# Patient Record
Sex: Male | Born: 1975 | Race: White | Hispanic: No | Marital: Single | Smoking: Never smoker
Health system: Southern US, Community
[De-identification: ages and names within clinical notes are randomized; demographics above are authoritative.]

## PROBLEM LIST (undated history)

## (undated) DIAGNOSIS — G4730 Sleep apnea, unspecified: Secondary | ICD-10-CM

## (undated) DIAGNOSIS — R519 Headache, unspecified: Secondary | ICD-10-CM

## (undated) DIAGNOSIS — R51 Headache: Secondary | ICD-10-CM

## (undated) HISTORY — PX: REFRACTIVE SURGERY: SHX103

## (undated) HISTORY — PX: WISDOM TOOTH EXTRACTION: SHX21

---

## 2018-04-30 ENCOUNTER — Other Ambulatory Visit: Payer: Self-pay | Admitting: Otolaryngology

## 2018-04-30 DIAGNOSIS — J349 Unspecified disorder of nose and nasal sinuses: Secondary | ICD-10-CM

## 2018-04-30 DIAGNOSIS — J329 Chronic sinusitis, unspecified: Secondary | ICD-10-CM

## 2018-05-09 ENCOUNTER — Ambulatory Visit
Admission: RE | Admit: 2018-05-09 | Discharge: 2018-05-09 | Disposition: A | Discharge status: Home / Self Care | Payer: 59 | Source: Ambulatory Visit | Attending: Otolaryngology | Admitting: Otolaryngology

## 2018-05-09 DIAGNOSIS — J329 Chronic sinusitis, unspecified: Secondary | ICD-10-CM

## 2018-05-09 DIAGNOSIS — J349 Unspecified disorder of nose and nasal sinuses: Secondary | ICD-10-CM

## 2018-05-29 ENCOUNTER — Ambulatory Visit: Payer: Self-pay | Admitting: Otolaryngology

## 2018-05-29 NOTE — Pre-Procedure Instructions (Signed)
Sofie RowerJeffrey Pruiett  05/29/2018      [facility] - [place], [place] - [address] [place] [id number] Phone: [phone] Fax: [phone]    Your procedure is scheduled on Friday, August 23rd.  [facility] Ad[facility] at 8:00 A.M.  Call this number if you have problems the morning of surgery:  [phone]   Remember:  Do not eat or drink after midnight.      Take these medicines the morning of surgery with A SIP OF WATER  gemfibrozil (LOPID) 600 MG tablet topiramate (TOPAMAX) 25 MG tablet  As needed:  fluticasone (FLONASE) 50 MCG/ACT nasal spray  7 days prior to surgery STOP taking any Aspirin(unless otherwise instructed by your surgeon), Aleve, Naproxen, Ibuprofen, Motrin, Advil, Goody's, BC's, all herbal medications, fish oil, and all vitamins     Do not wear jewelry.  Do not wear lotions, powders, or perfumes, or deodorant.  Men may shave face and neck.  Do not bring valuables to the hospital.  [facility] is not responsible for any belongings or valuables.  Contacts, dentures or bridgework may not be worn into surgery.  Leave your suitcase in the car.  After surgery it may be brought to your room.  For patients admitted to the hospital, discharge time will be determined by your treatment team.  Patients discharged the day of surgery will not be allowed to drive home.    Special instructions:   [facility]- Preparing For Surgery  Before surgery, you can play an important role. Because skin is not sterile, your skin needs to be as free of germs as possible. You can reduce the number of germs on your skin by washing with CHG (chlorahexidine gluconate) Soap before surgery.  CHG is an antiseptic cleaner which kills germs and bonds with the skin to continue killing germs even after washing.    Oral Hygiene is also important to reduce your risk of infection.  Remember - BRUSH YOUR TEETH THE MORNING OF SURGERY  WITH YOUR REGULAR TOOTHPASTE  Please do not use if you have an allergy to CHG or antibacterial soaps. If your skin becomes reddened/irritated stop using the CHG.  Do not shave (including legs and underarms) for at least 48 hours prior to first CHG shower. It is OK to shave your face.  Please follow these instructions carefully.   1. Shower the NIGHT BEFORE SURGERY and the MORNING OF SURGERY with CHG.   2. If you chose to wash your hair, wash your hair first as usual with your normal shampoo.  3. After you shampoo, rinse your hair and body thoroughly to remove the shampoo.  4. Use CHG as you would any other liquid soap. You can apply CHG directly to the skin and wash gently with a scrungie or a clean washcloth.   5. Apply the CHG Soap to your body ONLY FROM THE NECK DOWN.  Do not use on open wounds or open sores. Avoid contact with your eyes, ears, mouth and genitals (private parts). Wash Face and genitals (private parts)  with your normal soap.  6. Wash thoroughly, paying special attention to the area where your surgery will be performed.  7. Thoroughly rinse your body with warm water from the neck down.  8. DO NOT shower/wash with your normal soap after using and rinsing off the CHG Soap.  9. Pat yourself dry with a CLEAN TOWEL.  10. Wear CLEAN PAJAMAS to bed the  night before surgery, wear comfortable clothes the morning of surgery  11. Place CLEAN SHEETS on your bed the night of your first shower and DO NOT SLEEP WITH PETS.    Day of Surgery:  Do not apply any deodorants/lotions.  Please wear clean clothes to the hospital/surgery center.   Remember to brush your teeth WITH YOUR REGULAR TOOTHPASTE.    Please read over the following fact sheets that you were given.

## 2018-05-29 NOTE — H&P (Signed)
PREOPERATIVE H&P  Chief Complaint: Chronic nasal obstruction  HPI: Mark Braun is a 42 y.o. male who presents for evaluation of chronic nasal obstruction and history of sinus infections. He uses nasal steroid sprays as well as regular use of Afrin in order to breathe adequately. He has history of obstructive sleep apnea and wears nasal CPAP most nights but has difficulty tolerating this. He had a CT scan performed which showed clear paranasal sinuses with septal deviation and turbinate hypertrophy. He is taken to the operating room at this time for septoplasty and turbinate reductions to help improve his nasal airway.  No past medical history on file.  Social History   Socioeconomic History  . Marital status: Single    Spouse name: Not on file  . Number of children: Not on file  . Years of education: Not on file  . Highest education level: Not on file  Occupational History  . Not on file  Social Needs  . Financial resource strain: Not on file  . Food insecurity:    Worry: Not on file    Inability: Not on file  . Transportation needs:    Medical: Not on file    Non-medical: Not on file  Tobacco Use  . Smoking status: Not on file  Substance and Sexual Activity  . Alcohol use: Not on file  . Drug use: Not on file  . Sexual activity: Not on file  Lifestyle  . Physical activity:    Days per week: Not on file    Minutes per session: Not on file  . Stress: Not on file  Relationships  . Social connections:    Talks on phone: Not on file    Gets together: Not on file    Attends religious service: Not on file    Active member of club or organization: Not on file    Attends meetings of clubs or organizations: Not on file    Relationship status: Not on file  Other Topics Concern  . Not on file  Social History Narrative  . Not on file   No family history on file. No Known Allergies Prior to Admission medications   Medication Sig Start Date End Date Taking? Authorizing  Provider  fluticasone (FLONASE) 50 MCG/ACT nasal spray Place 1 spray into both nostrils daily as needed for allergies or rhinitis.   Yes [provider]  gemfibrozil (LOPID) 600 MG tablet Take 600 mg by mouth 2 (two) times daily.   Yes [provider]  topiramate (TOPAMAX) 25 MG tablet Take 75 mg by mouth 2 (two) times daily.   Yes [provider]     Positive ROS: per history of present illness  All other systems have been reviewed and were otherwise negative with the exception of those mentioned in the HPI and as above.  Physical Exam: There were no vitals filed for this visit.  General: Alert, no acute distress Oral: Normal oral mucosa and tonsils Nasal: septal deviation to the right with large turbinates. No polyps noted. Neck: No palpable adenopathy or thyroid nodules Ear: Ear canal is clear with normal appearing TMs Cardiovascular: Regular rate and rhythm, no murmur.  Respiratory: Clear to auscultation Neurologic: Alert and oriented x 3   Assessment/Plan: deviated septum with nasal obstruction. obstructive sleep apnea Plan for Procedure(s): NASAL SEPTOPLASTY WITH BILATERAL TURBINATE REDUCTION   Dillard Cannonhristopher Amed Datta, MD 05/29/2018 4:32 PM

## 2018-05-30 ENCOUNTER — Encounter (HOSPITAL_COMMUNITY)
Admission: RE | Admit: 2018-05-30 | Discharge: 2018-05-30 | Disposition: A | Discharge status: Home / Self Care | Payer: 59 | Source: Ambulatory Visit | Attending: Otolaryngology | Admitting: Otolaryngology

## 2018-05-30 ENCOUNTER — Encounter (HOSPITAL_COMMUNITY): Payer: Self-pay

## 2018-05-30 ENCOUNTER — Other Ambulatory Visit: Payer: Self-pay

## 2018-05-30 DIAGNOSIS — Z01812 Encounter for preprocedural laboratory examination: Secondary | ICD-10-CM | POA: Insufficient documentation

## 2018-05-30 HISTORY — DX: Headache, unspecified: R51.9

## 2018-05-30 HISTORY — DX: Sleep apnea, unspecified: G47.30

## 2018-05-30 HISTORY — DX: Headache: R51

## 2018-05-30 LAB — CBC
HEMATOCRIT: 48.4 % (ref 39.0–52.0)
Hemoglobin: 15.9 g/dL (ref 13.0–17.0)
MCH: 28.3 pg (ref 26.0–34.0)
MCHC: 32.9 g/dL (ref 30.0–36.0)
MCV: 86.1 fL (ref 78.0–100.0)
PLATELETS: 405 10*3/uL — AB (ref 150–400)
RBC: 5.62 MIL/uL (ref 4.22–5.81)
RDW: 12.9 % (ref 11.5–15.5)
WBC: 7.2 10*3/uL (ref 4.0–10.5)

## 2018-06-08 ENCOUNTER — Ambulatory Visit (HOSPITAL_COMMUNITY): Payer: 59 | Admitting: Certified Registered Nurse Anesthetist

## 2018-06-08 ENCOUNTER — Observation Stay (HOSPITAL_COMMUNITY)
Admission: RE | Admit: 2018-06-08 | Discharge: 2018-06-09 | Disposition: A | Discharge status: Home / Self Care | Payer: 59 | Source: Ambulatory Visit | Attending: Otolaryngology | Admitting: Otolaryngology

## 2018-06-08 ENCOUNTER — Encounter (HOSPITAL_COMMUNITY): Payer: Self-pay | Admitting: *Deleted

## 2018-06-08 ENCOUNTER — Encounter (HOSPITAL_COMMUNITY)
Admission: RE | Disposition: A | Discharge status: Home / Self Care | Payer: Self-pay | Source: Ambulatory Visit | Attending: Otolaryngology

## 2018-06-08 ENCOUNTER — Other Ambulatory Visit: Payer: Self-pay

## 2018-06-08 DIAGNOSIS — E669 Obesity, unspecified: Secondary | ICD-10-CM | POA: Diagnosis not present

## 2018-06-08 DIAGNOSIS — G4733 Obstructive sleep apnea (adult) (pediatric): Principal | ICD-10-CM | POA: Insufficient documentation

## 2018-06-08 DIAGNOSIS — J342 Deviated nasal septum: Secondary | ICD-10-CM | POA: Diagnosis not present

## 2018-06-08 DIAGNOSIS — Z79899 Other long term (current) drug therapy: Secondary | ICD-10-CM | POA: Insufficient documentation

## 2018-06-08 DIAGNOSIS — J3489 Other specified disorders of nose and nasal sinuses: Secondary | ICD-10-CM | POA: Insufficient documentation

## 2018-06-08 DIAGNOSIS — J343 Hypertrophy of nasal turbinates: Secondary | ICD-10-CM | POA: Insufficient documentation

## 2018-06-08 HISTORY — PX: NASAL SEPTOPLASTY W/ TURBINOPLASTY: SHX2070

## 2018-06-08 SURGERY — SEPTOPLASTY, NOSE, WITH NASAL TURBINATE REDUCTION
Anesthesia: General | Site: Nose | Laterality: Bilateral

## 2018-06-08 MED ORDER — CEFAZOLIN SODIUM-DEXTROSE 2-4 GM/100ML-% IV SOLN
2.0000 g | INTRAVENOUS | Status: AC
  Administered 2018-06-08: 2 g via INTRAVENOUS
  Filled 2018-06-08: qty 100

## 2018-06-08 MED ORDER — ETOMIDATE 2 MG/ML IV SOLN
INTRAVENOUS | Status: AC
  Filled 2018-06-08: qty 20

## 2018-06-08 MED ORDER — PROPOFOL 10 MG/ML IV BOLUS
INTRAVENOUS | Status: AC
  Filled 2018-06-08: qty 40

## 2018-06-08 MED ORDER — DEXAMETHASONE SODIUM PHOSPHATE 10 MG/ML IJ SOLN
INTRAMUSCULAR | Status: DC | PRN
  Administered 2018-06-08: 10 mg via INTRAVENOUS

## 2018-06-08 MED ORDER — FENTANYL CITRATE (PF) 100 MCG/2ML IJ SOLN: 25.0000 ug | INTRAMUSCULAR | Status: DC | PRN

## 2018-06-08 MED ORDER — LIDOCAINE 2% (20 MG/ML) 5 ML SYRINGE
INTRAMUSCULAR | Status: AC
  Filled 2018-06-08: qty 10

## 2018-06-08 MED ORDER — LACTATED RINGERS IV SOLN
INTRAVENOUS | Status: DC
  Administered 2018-06-08: 09:00:00 via INTRAVENOUS

## 2018-06-08 MED ORDER — LIDOCAINE 2% (20 MG/ML) 5 ML SYRINGE
INTRAMUSCULAR | Status: DC | PRN
  Administered 2018-06-08: 100 mg via INTRAVENOUS

## 2018-06-08 MED ORDER — MIDAZOLAM HCL 5 MG/5ML IJ SOLN
INTRAMUSCULAR | Status: DC | PRN
  Administered 2018-06-08: 2 mg via INTRAVENOUS

## 2018-06-08 MED ORDER — DEXAMETHASONE SODIUM PHOSPHATE 10 MG/ML IJ SOLN
INTRAMUSCULAR | Status: AC
  Filled 2018-06-08: qty 1

## 2018-06-08 MED ORDER — PROMETHAZINE HCL 25 MG/ML IJ SOLN: 6.2500 mg | INTRAMUSCULAR | Status: DC | PRN

## 2018-06-08 MED ORDER — CEFAZOLIN SODIUM-DEXTROSE 1-4 GM/50ML-% IV SOLN
1.0000 g | Freq: Three times a day (TID) | INTRAVENOUS | Status: DC
  Administered 2018-06-08 – 2018-06-09 (×3): 1 g via INTRAVENOUS
  Filled 2018-06-08 (×3): qty 50

## 2018-06-08 MED ORDER — 0.9 % SODIUM CHLORIDE (POUR BTL) OPTIME
TOPICAL | Status: DC | PRN
  Administered 2018-06-08: 1000 mL

## 2018-06-08 MED ORDER — LIDOCAINE-EPINEPHRINE 1 %-1:100000 IJ SOLN
INTRAMUSCULAR | Status: AC
  Filled 2018-06-08: qty 1

## 2018-06-08 MED ORDER — MUPIROCIN 2 % EX OINT
TOPICAL_OINTMENT | CUTANEOUS | Status: DC | PRN
  Administered 2018-06-08: 1 via TOPICAL

## 2018-06-08 MED ORDER — MIDAZOLAM HCL 2 MG/2ML IJ SOLN
INTRAMUSCULAR | Status: AC
  Filled 2018-06-08: qty 2

## 2018-06-08 MED ORDER — ROCURONIUM BROMIDE 50 MG/5ML IV SOSY
PREFILLED_SYRINGE | INTRAVENOUS | Status: AC
  Filled 2018-06-08: qty 5

## 2018-06-08 MED ORDER — METOPROLOL TARTRATE 12.5 MG HALF TABLET
12.5000 mg | ORAL_TABLET | Freq: Two times a day (BID) | ORAL | Status: DC
  Administered 2018-06-08 (×2): 12.5 mg via ORAL
  Filled 2018-06-08 (×2): qty 1

## 2018-06-08 MED ORDER — METHYLPREDNISOLONE ACETATE 80 MG/ML IJ SUSP
INTRAMUSCULAR | Status: DC | PRN
  Administered 2018-06-08: 80 mg

## 2018-06-08 MED ORDER — GEMFIBROZIL 600 MG PO TABS
600.0000 mg | ORAL_TABLET | Freq: Two times a day (BID) | ORAL | Status: DC
  Administered 2018-06-08: 600 mg via ORAL
  Filled 2018-06-08 (×2): qty 1

## 2018-06-08 MED ORDER — CHLORHEXIDINE GLUCONATE CLOTH 2 % EX PADS: 6.0000 | MEDICATED_PAD | Freq: Once | CUTANEOUS | Status: DC

## 2018-06-08 MED ORDER — PROPOFOL 10 MG/ML IV BOLUS
INTRAVENOUS | Status: AC
  Filled 2018-06-08: qty 20

## 2018-06-08 MED ORDER — IBUPROFEN 100 MG/5ML PO SUSP
400.0000 mg | Freq: Four times a day (QID) | ORAL | Status: DC | PRN
  Filled 2018-06-08: qty 20

## 2018-06-08 MED ORDER — ONDANSETRON HCL 4 MG/2ML IJ SOLN: 4.0000 mg | INTRAMUSCULAR | Status: DC | PRN

## 2018-06-08 MED ORDER — MUPIROCIN 2 % EX OINT
TOPICAL_OINTMENT | CUTANEOUS | Status: AC
  Filled 2018-06-08: qty 22

## 2018-06-08 MED ORDER — OXYMETAZOLINE HCL 0.05 % NA SOLN
NASAL | Status: DC | PRN
  Administered 2018-06-08: 1

## 2018-06-08 MED ORDER — ROCURONIUM BROMIDE 50 MG/5ML IV SOSY
PREFILLED_SYRINGE | INTRAVENOUS | Status: DC | PRN
  Administered 2018-06-08 (×3): 10 mg via INTRAVENOUS
  Administered 2018-06-08: 50 mg via INTRAVENOUS
  Administered 2018-06-08: 10 mg via INTRAVENOUS

## 2018-06-08 MED ORDER — TOPIRAMATE 25 MG PO TABS
75.0000 mg | ORAL_TABLET | Freq: Two times a day (BID) | ORAL | Status: DC
  Administered 2018-06-08: 75 mg via ORAL
  Filled 2018-06-08: qty 3

## 2018-06-08 MED ORDER — HYDROCODONE-ACETAMINOPHEN 5-325 MG PO TABS
1.0000 | ORAL_TABLET | ORAL | Status: DC | PRN
  Administered 2018-06-08: 2 via ORAL
  Administered 2018-06-09: 1 via ORAL
  Filled 2018-06-08: qty 1
  Filled 2018-06-08: qty 2

## 2018-06-08 MED ORDER — FENTANYL CITRATE (PF) 100 MCG/2ML IJ SOLN
INTRAMUSCULAR | Status: DC | PRN
  Administered 2018-06-08: 100 ug via INTRAVENOUS

## 2018-06-08 MED ORDER — FENTANYL CITRATE (PF) 250 MCG/5ML IJ SOLN
INTRAMUSCULAR | Status: AC
  Filled 2018-06-08: qty 5

## 2018-06-08 MED ORDER — OXYMETAZOLINE HCL 0.05 % NA SOLN
NASAL | Status: AC
  Filled 2018-06-08: qty 15

## 2018-06-08 MED ORDER — LIDOCAINE-EPINEPHRINE 1 %-1:100000 IJ SOLN
INTRAMUSCULAR | Status: DC | PRN
  Administered 2018-06-08: 15 mL

## 2018-06-08 MED ORDER — ONDANSETRON HCL 4 MG/2ML IJ SOLN
INTRAMUSCULAR | Status: DC | PRN
  Administered 2018-06-08: 4 mg via INTRAVENOUS

## 2018-06-08 MED ORDER — ONDANSETRON HCL 4 MG PO TABS: 4.0000 mg | ORAL_TABLET | ORAL | Status: DC | PRN

## 2018-06-08 MED ORDER — ONDANSETRON HCL 4 MG/2ML IJ SOLN
INTRAMUSCULAR | Status: AC
  Filled 2018-06-08: qty 2

## 2018-06-08 MED ORDER — PROPOFOL 10 MG/ML IV BOLUS
INTRAVENOUS | Status: DC | PRN
  Administered 2018-06-08: 250 mg via INTRAVENOUS

## 2018-06-08 MED ORDER — SUGAMMADEX SODIUM 200 MG/2ML IV SOLN
INTRAVENOUS | Status: DC | PRN
  Administered 2018-06-08: 200 mg via INTRAVENOUS

## 2018-06-08 MED ORDER — OXYCODONE HCL 5 MG PO TABS: 5.0000 mg | ORAL_TABLET | Freq: Once | ORAL | Status: DC | PRN

## 2018-06-08 MED ORDER — OXYCODONE HCL 5 MG/5ML PO SOLN: 5.0000 mg | Freq: Once | ORAL | Status: DC | PRN

## 2018-06-08 MED ORDER — MORPHINE SULFATE (PF) 2 MG/ML IV SOLN
2.0000 mg | INTRAVENOUS | Status: DC | PRN
  Administered 2018-06-08: 2 mg via INTRAVENOUS
  Filled 2018-06-08 (×2): qty 1

## 2018-06-08 MED ORDER — BACITRACIN ZINC 500 UNIT/GM EX OINT: TOPICAL_OINTMENT | CUTANEOUS | Status: DC | PRN

## 2018-06-08 MED ORDER — BACITRACIN ZINC 500 UNIT/GM EX OINT
TOPICAL_OINTMENT | CUTANEOUS | Status: AC
  Filled 2018-06-08: qty 28.35

## 2018-06-08 MED ORDER — SUMATRIPTAN SUCCINATE 25 MG PO TABS
25.0000 mg | ORAL_TABLET | ORAL | Status: DC | PRN
  Administered 2018-06-08 (×2): 25 mg via ORAL
  Filled 2018-06-08 (×6): qty 1

## 2018-06-08 SURGICAL SUPPLY — 49 items
BLADE INF TURB ROT M4 2 5PK (BLADE) ×4 IMPLANT
BLADE SURG 15 STRL LF DISP TIS (BLADE) ×1 IMPLANT
BLADE SURG 15 STRL SS (BLADE) ×1
CANISTER SUCT 3000ML (MISCELLANEOUS) ×2 IMPLANT
CANISTER SUCT 3000ML PPV (MISCELLANEOUS) ×2 IMPLANT
CLEANER TIP ELECTROSURG 2X2 (MISCELLANEOUS) ×2 IMPLANT
COAGULATOR SUCT 8FR VV (MISCELLANEOUS) ×2 IMPLANT
COAGULATOR SUCT SWTCH 10FR 6 (ELECTROSURGICAL) ×2 IMPLANT
CRADLE DONUT ADULT HEAD (MISCELLANEOUS) ×2 IMPLANT
DRAPE HALF SHEET 40X57 (DRAPES) ×2 IMPLANT
DRAPE ORTHO SPLIT 77X108 STRL (DRAPES) ×1
DRAPE SURG ORHT 6 SPLT 77X108 (DRAPES) ×1 IMPLANT
DRESSING NASAL KENNEDY 3.5X.9 (MISCELLANEOUS) IMPLANT
DRESSING TELFA 8X10 (GAUZE/BANDAGES/DRESSINGS) IMPLANT
DRSG NASAL KENNEDY 3.5X.9 (MISCELLANEOUS)
DRSG TELFA 3X8 NADH (GAUZE/BANDAGES/DRESSINGS) ×4 IMPLANT
ELECT COATED BLADE 2.86 ST (ELECTRODE) ×2 IMPLANT
ELECT REM PT RETURN 9FT ADLT (ELECTROSURGICAL)
ELECTRODE REM PT RTRN 9FT ADLT (ELECTROSURGICAL) IMPLANT
GAUZE SPONGE 2X2 8PLY STRL LF (GAUZE/BANDAGES/DRESSINGS) ×2 IMPLANT
GLOVE SS BIOGEL STRL SZ 7.5 (GLOVE) ×1 IMPLANT
GLOVE SUPERSENSE BIOGEL SZ 7.5 (GLOVE) ×1
GOWN STRL REUS W/ TWL LRG LVL3 (GOWN DISPOSABLE) ×1 IMPLANT
GOWN STRL REUS W/ TWL XL LVL3 (GOWN DISPOSABLE) ×1 IMPLANT
GOWN STRL REUS W/TWL LRG LVL3 (GOWN DISPOSABLE) ×1
GOWN STRL REUS W/TWL XL LVL3 (GOWN DISPOSABLE) ×1
KIT BASIN OR (CUSTOM PROCEDURE TRAY) ×2 IMPLANT
KIT TURNOVER KIT B (KITS) ×2 IMPLANT
NEEDLE 18GX1X1/2 (RX/OR ONLY) (NEEDLE) ×2 IMPLANT
NEEDLE HYPO 25GX1X1/2 BEV (NEEDLE) ×2 IMPLANT
NEEDLE PRECISIONGLIDE 27X1.5 (NEEDLE) ×2 IMPLANT
NS IRRIG 1000ML POUR BTL (IV SOLUTION) ×2 IMPLANT
PAD ARMBOARD 7.5X6 YLW CONV (MISCELLANEOUS) ×4 IMPLANT
PATTIES SURGICAL .5 X3 (DISPOSABLE) ×2 IMPLANT
PENCIL FOOT CONTROL (ELECTRODE) ×2 IMPLANT
SPLINT NASAL DOYLE BI-VL (GAUZE/BANDAGES/DRESSINGS) IMPLANT
SPONGE GAUZE 2X2 STER 10/PKG (GAUZE/BANDAGES/DRESSINGS) ×2
STRIP CLOSURE SKIN 1/2X4 (GAUZE/BANDAGES/DRESSINGS) IMPLANT
SUT CHROMIC 3 0 PS 2 (SUTURE) ×2 IMPLANT
SUT CHROMIC 4 0 PS 2 18 (SUTURE) ×2 IMPLANT
SUT CHROMIC 5 0 P 3 (SUTURE) IMPLANT
SUT ETHILON 3 0 PS 1 (SUTURE) ×2 IMPLANT
SUT ETHILON 4 0 PS 2 18 (SUTURE) IMPLANT
SUT ETHILON 5 0 P 3 18 (SUTURE)
SUT NYLON ETHILON 5-0 P-3 1X18 (SUTURE) IMPLANT
SUT SILK 2 0 PERMA HAND 18 BK (SUTURE) ×4 IMPLANT
SYR 3ML LL SCALE MARK (SYRINGE) ×4 IMPLANT
TRAY ENT MC OR (CUSTOM PROCEDURE TRAY) ×2 IMPLANT
WATER STERILE IRR 1000ML POUR (IV SOLUTION) ×2 IMPLANT

## 2018-06-08 NOTE — Anesthesia Procedure Notes (Signed)
Procedure Name: Intubation Date/Time: 06/08/2018 10:12 AM Performed by: Candis Shine, CRNA Pre-anesthesia Checklist: Patient identified, Emergency Drugs available, Suction available and Patient being monitored Patient Re-evaluated:Patient Re-evaluated prior to induction Oxygen Delivery Method: Circle System Utilized Preoxygenation: Pre-oxygenation with 100% oxygen Induction Type: IV induction Ventilation: Mask ventilation without difficulty Laryngoscope Size: Mac and 4 Grade View: Grade I Tube type: Oral Tube size: 7.5 mm Number of attempts: 1 Airway Equipment and Method: Stylet and Oral airway Placement Confirmation: ETT inserted through vocal cords under direct vision,  positive ETCO2 and breath sounds checked- equal and bilateral Secured at: 22 cm Tube secured with: Tape Dental Injury: Teeth and Oropharynx as per pre-operative assessment

## 2018-06-08 NOTE — Anesthesia Postprocedure Evaluation (Signed)
Anesthesia Post Note  Patient: Mark RowerJeffrey Braun  Procedure(s) Performed: NASAL SEPTOPLASTY WITH BILATERAL TURBINATE REDUCTION (Bilateral Nose)     Patient location during evaluation: PACU Anesthesia Type: General Level of consciousness: awake and alert Pain management: pain level controlled Vital Signs Assessment: post-procedure vital signs reviewed and stable Respiratory status: spontaneous breathing, nonlabored ventilation and respiratory function stable Cardiovascular status: blood pressure returned to baseline and stable Postop Assessment: no apparent nausea or vomiting Anesthetic complications: no    Last Vitals:  Vitals:   06/08/18 1320 06/08/18 1330  BP: (!) 136/100   Pulse: 83 87  Resp: 15 14  Temp:  36.6 C  SpO2: 98% 97%    Last Pain:  Vitals:   06/08/18 1330  TempSrc: Oral  PainSc:                  Beryle Lathehomas E Rinnah Peppel

## 2018-06-08 NOTE — Brief Op Note (Signed)
06/08/2018  12:04 PM  PATIENT:  Sofie RowerJeffrey Sharp  42 y.o. male  PRE-OPERATIVE DIAGNOSIS:  deviated septum  obstructive sleep apnea , turbinate hypertrophy  POST-OPERATIVE DIAGNOSIS:  Deviated Septum Obstructive Sleep Apnea, turbinate hypertrophy  PROCEDURE:  Procedure(s): NASAL SEPTOPLASTY WITH BILATERAL TURBINATE REDUCTION (Bilateral)  SURGEON:  Surgeon(s) and Role:    Drema Halon* Newman, Christopher E, MD - Primary  PHYSICIAN ASSISTANT:   ASSISTANTS: none   ANESTHESIA:   general  EBL:  100 mL   BLOOD ADMINISTERED:none  DRAINS: none   LOCAL MEDICATIONS USED:  XYLOCAINE with epi 14 cc  SPECIMEN:  No Specimen  DISPOSITION OF SPECIMEN:  N/A  COUNTS:  YES  TOURNIQUET:  * No tourniquets in log *  DICTATION: .Other Dictation: Dictation Number [id number]  PLAN OF CARE: Admit for overnight observation  PATIENT DISPOSITION:  PACU - hemodynamically stable.   Delay start of Pharmacological VTE agent (>24hrs) due to surgical blood loss or risk of bleeding: yes

## 2018-06-08 NOTE — Progress Notes (Signed)
Post Op check Awake alert without respiratory problems C/o right sided migraine type HA No bleeding noted. Will plan on removing nasal packing in am and discharge patient. Stable post op course

## 2018-06-08 NOTE — Op Note (Signed)
NAMSofie Braun: Braun, Mark MEDICAL RECORD [id number] ACCOUNT [id number] DATE OF BIRTH:Mar 01, 1976 FACILITY: [facility] LOCATION: [facility] PH[facility]ICIAN:Mark Braxton FeathersE. NEWMAN, MD  OPERATIVE REPORT  DATE OF PROCEDURE:  06/08/2018  PREOPERATIVE DIAGNOSES:  Chronic nasal obstruction with septal deviation to the right and turbinate hypertrophy.  POSTOPERATIVE DIAGNOSES:  Chronic nasal obstruction with septal deviation to the right and turbinate hypertrophy.  OPERATION PERFORMED:  Via septoplasty with bilateral inferior turbinate reductions with a Medtronic turbinate blade.  SURGEON:  Mark Cannonhristopher Newman, MD  ANESTHESIA:  General endotracheal.  ESTIMATED BLOOD LOSS:  80 mL.  COMPLICATIONS:  None.  BRIEF CLINICAL NOTE:  Mark RowerJeffrey Braun is a 42 year old gentleman who has had a longstanding history of nasal obstruction as well as obstructive sleep apnea.  He wears a CPAP at night on a regular basis.  He had a CT scan of the sinuses that showed clear  paranasal sinuses but did reveal a deviated septum to the right.  On exam, he has a narrow right nasal pathway compared to the left as well as very large turbinates bilaterally.  He was taken to the operating room at this time for septoplasty and  turbinate reduction to help improve his nasal airway.  He has previously used nasal steroid sprays and is presently using Afrin on a regular basis in order to adequately breathe.  DESCRIPTION OF PROCEDURE:  After adequate endotracheal anesthesia, the patient received 2 g of Ancef and 10 mg of Decadron IV preoperatively.  The nose was then further prepped with Betadine, draped off in sterile towels.  The septum area was injected  with 10 mL of Xylocaine with epinephrine along with the anterior inferior turbinates.  On exam, the patient had an anterior cartilaginous deviation of the septum to the right.  More posteriorly, the bony septum also put protruded more to the right.  He  had large thick turbinates bilaterally.   No polyps noted.  Both middle meatus regions were clear.  Nasopharynx was clear.  A hemitransfixion incision was made along the cartilage of the septum on the right side, and mucoperichondrial and mucoperiosteal  flap was elevated posteriorly on the right side.  A portion of the cartilaginous septum had bowed off to the right along the inferior maxillary crest and was removed along with posterior cartilaginous septum on the right side.  Just anterior to the bony  septum, a vertical incision was made, and the bony portion of the septum that bowed to the right was also removed after elevating mucoperiosteal flaps on either side of the bony septum.  This allowed the septum to return much better toward midline.   Following this, inferior turbinate reductions were performed.  Using the Medtronic turbinate blade, submucosal turbinate reductions were performed bilaterally.  On the left side was a turbinate that was a little bit more bony and protruded a little bit  more.  Some of the turbinate bone was removed on the left side only.  The turbinates were then outfractured bilaterally, and suction cautery was used to control any bleeding and to cauterize the posterior aspect of the inferior turbinates bilaterally.   Prior to closing, 80 mg of Depo-Medrol was injected into the turbinates bilaterally.  The hemitransfixion incision was closed with 3-0 chromic suture, and the septum was basted with a 3-0 chromic suture.  The turbinates were outfractured, and hemostasis  was again checked and obtained with suction cautery.  The nose was then packed with Telfa-soaked in mupirocin ointment and placed bilaterally in the nose.  This was secured anteriorly  with a 2-0 silk suture.  This completed the procedure.  The patient  was awoken from anesthesia and transferred to recovery room and postoperatively doing well.  DISPOSITION:  The patient will be observed overnight in the step-down unit for observation of his airway.  We  will plan on removing nasal packs in the morning and discharging him home to resume his CPAP therapy.  LN/NUANCE  D:06/08/2018 T:06/08/2018 JOB:[id number]

## 2018-06-08 NOTE — Anesthesia Preprocedure Evaluation (Addendum)
Anesthesia Evaluation  Patient identified by MRN, date of birth, ID band Patient awake    Reviewed: Allergy & Precautions, NPO status , Patient's Chart, lab work & pertinent test results  History of Anesthesia Complications Negative for: history of anesthetic complications  Airway Mallampati: II  TM Distance: >3 FB Neck ROM: Full    Dental  (+) Dental Advisory Given, Teeth Intact   Pulmonary sleep apnea and Continuous Positive Airway Pressure Ventilation ,    breath sounds clear to auscultation       Cardiovascular Exercise Tolerance: Good negative cardio ROS   Rhythm:Regular Rate:Normal     Neuro/Psych  Headaches, negative psych ROS   GI/Hepatic negative GI ROS, Neg liver ROS,   Endo/Other   Obesity   Renal/GU negative Renal ROS  negative genitourinary   Musculoskeletal negative musculoskeletal ROS (+)   Abdominal   Peds  Hematology negative hematology ROS (+)   Anesthesia Other Findings   Reproductive/Obstetrics                            Anesthesia Physical Anesthesia Plan  ASA: II  Anesthesia Plan: General   Post-op Pain Management:    Induction: Intravenous  PONV Risk Score and Plan: 2 and Treatment may vary due to age or medical condition, Ondansetron, Dexamethasone and Midazolam  Airway Management Planned: Oral ETT  Additional Equipment: None  Intra-op Plan:   Post-operative Plan: Extubation in OR  Informed Consent: I have reviewed the patients History and Physical, chart, labs and discussed the procedure including the risks, benefits and alternatives for the proposed anesthesia with the patient or authorized representative who has indicated his/her understanding and acceptance.   Dental advisory given  Plan Discussed with: CRNA and Anesthesiologist  Anesthesia Plan Comments:        Anesthesia Quick Evaluation

## 2018-06-08 NOTE — Interval H&P Note (Signed)
History and Physical Interval Note:  06/08/2018 9:58 AM  Mark RowerJeffrey Sitton  has presented today for surgery, with the diagnosis of deviated septum  obstructive sleep apnea  The various methods of treatment have been discussed with the patient and family. After consideration of risks, benefits and other options for treatment, the patient has consented to  Procedure(s): NASAL SEPTOPLASTY WITH BILATERAL TURBINATE REDUCTION (Bilateral) as a surgical intervention .  The patient's history has been reviewed, patient examined, no change in status, stable for surgery.  I have reviewed the patient's chart and labs.  Questions were answered to the patient's satisfaction.     [name]

## 2018-06-08 NOTE — Transfer of Care (Signed)
Immediate Anesthesia Transfer of Care Note  Patient: Mark Braun  Procedure(s) Performed: NASAL SEPTOPLASTY WITH BILATERAL TURBINATE REDUCTION (Bilateral Nose)  Patient Location: PACU  Anesthesia Type:General  Level of Consciousness: awake, alert  and oriented  Airway & Oxygen Therapy: Patient Spontanous Breathing and Patient connected to face mask oxygen  Post-op Assessment: Report given to RN and Post -op Vital signs reviewed and stable  Post vital signs: Reviewed and stable  Last Vitals:  Vitals Value Taken Time  BP 141/88 06/08/2018 12:20 PM  Temp    Pulse 81 06/08/2018 12:20 PM  Resp 16 06/08/2018 12:20 PM  SpO2 100 % 06/08/2018 12:20 PM  Vitals shown include unvalidated device data.  Last Pain:  Vitals:   06/08/18 0826  TempSrc:   PainSc: 0-No pain      Patients Stated Pain Goal: 3 (06/08/18 0826)  Complications: No apparent anesthesia complications

## 2018-06-09 ENCOUNTER — Encounter (HOSPITAL_COMMUNITY): Payer: Self-pay | Admitting: Otolaryngology

## 2018-06-09 DIAGNOSIS — G4733 Obstructive sleep apnea (adult) (pediatric): Secondary | ICD-10-CM | POA: Diagnosis not present

## 2018-06-09 MED ORDER — HYDROCODONE-ACETAMINOPHEN 5-325 MG PO TABS: 1.0000 | ORAL_TABLET | Freq: Four times a day (QID) | ORAL | 0 refills | Status: DC | PRN

## 2018-06-09 MED ORDER — CEPHALEXIN 500 MG PO CAPS: 500.0000 mg | ORAL_CAPSULE | Freq: Two times a day (BID) | ORAL | 0 refills | Status: DC

## 2018-06-09 NOTE — Progress Notes (Signed)
Postop day 1 Afebrile vital signs stable. Patient doing well with no trouble with airway. Nasal packs were removed with minimal bleeding. Patient will be discharged. He will follow-up in 5 days for recheck in my office. Discharge medications include Keflex 500 mg twice daily for 1 week.  Hydrocodone 5 mg tabs 1-2 every 6 hours as needed pain.

## 2018-06-09 NOTE — Progress Notes (Signed)
IV and telemetry discontinued at this time. CCMD notified. Discharge instructions reviewed with patient and patient's spouse. All questions answered.   Ernestina ColumbiaK. Starr Clarabell Matsuoka, RN

## 2018-06-09 NOTE — Discharge Instructions (Signed)
Use saline irrigation 2-3 times a day. Take your regular medications. Keflex 500 mg twice daily for 1 week Tylenol, ibuprofen or hydrocodone 5 mg tabs 1-2 every 6 hours as needed pain. Call office for follow-up appointment for next Wednesday or Thursday   [phone].  Or call if you have any questions.

## 2018-07-04 ENCOUNTER — Encounter: Payer: Self-pay | Admitting: Neurology

## 2018-08-31 NOTE — Discharge Summary (Signed)
NAMSofie Rower: Braun, Mark Braun MEDICAL RECORD [id number] ACCOUNT [id number] DATE OF BIRTH:July 31, 1976 FACILITY: [facility] LOCATION: [facility] PHYSICIAN:CHRISTOPHER Braxton FeathersE. NEWMAN, MD  DISCHARGE SUMMARY  DATE OF DISCHARGE:  06/09/2018  ADMISSION DIAGNOSES:   1.  Obstructive sleep apnea.   2.  Nasal obstruction with septal deviation and turbinate hypertrophy.  DISCHARGE DIAGNOSES:   1.  Obstructive sleep apnea.   2.  Nasal obstruction with septal deviation and turbinate hypertrophy.   OPERATIONS DURING THIS HOSPITALIZATION:  1.  Septoplasty with bilateral inferior turbinate reductions performed on 06/08/2018.  CONSULTS:  None.  HOSPITAL COURSE:  The patient was admitted via the operating room on 06/08/2018 at which time he underwent a septoplasty with bilateral inferior turbinate reductions and nasal packing.  Because of his history of obstructive sleep apnea.  He is admitted  for overnight observation.  The hospital course was uncomplicated.  He received perioperative Ancef.  His nasal packing was removed the following day after an uneventful night.  The patient was subsequently discharged home on his first postoperative day  breathing much better.  DISPOSITION:  The patient is discharged to home on Keflex 500 mg b.i.d. for [redacted] week along with Tylenol, ibuprofen and hydrocodone 5 mg tabs q.6 hours p.r.n. pain.  He will follow up in my office in 1 week for recheck  He was also instructed on saline  irrigations.  AN/NUANCE D:08/31/2018 T:08/31/2018 JOB:[id number]

## 2018-09-04 NOTE — Progress Notes (Signed)
NEUROLOGY CONSULTATION NOTE  Mark Braun MRN: [id number] DOB: [birth year]  Referring provider: Renford Dillsonald Polite, MD Primary care provider: Renford Dillsonald Polite, MD  Reason for consult:  headache  HISTORY OF PRESENT ILLNESS: Mark Braun is a 42 year old right-handed male with migraines and sleep apnea who presents for headaches.  History supplemented by referring providers note.  Onset:  Mid-30s Location:  Right periorbital Quality:  pressure Intensity:  7-8/10.  He denies new headache, thunderclap headache  Aura:  none Prodrome:  none Postdrome:  none Associated symptoms:  Photophobia, phonophobia, sometimes nausea.  He denies associated vomiting, visual disturbance, autonomic symptoms, unilateral numbness or weakness. Duration:  24 hours.  Sometimes in 30-40 minutes if sumatriptan taken early but sometimes wakes up with it.   Frequency:  Once a week Frequency of abortive medication: 1 day a week Triggers: Emotional stress (work-related) Aggravating factors:  Laying down Relieving factors:  Applying ice or pressure to above the eye, sitting up Activity:  Does not aggravate. Every once and a while when he gets up from bed in the morning, he feels a little dizzy, slight sense of movement for up to a minute.  He also hears "water" in his ear.  His PCP has never seen fluid on exam.   Decreased since septoplasty and turbinate reduction 3 or 4 months ago.  Prior to surgery, they were occurring 3 days a week.  Current NSAIDS:  none Current analgesics:  Excedrin Migraine (up to 8-9 in 24 hours) Current triptans: Sumatriptan 25 mg Current ergotamine:  none Current anti-emetic:  none Current muscle relaxants:  none Current anti-anxiolytic:  none Current sleep aide:  none Current Antihypertensive medications:  none Current Antidepressant medications:  none Current Anticonvulsant medications: Topiramate 75 mg twice daily Current anti-CGRP:  none Current Vitamins/Herbal/Supplements:   none Current Antihistamines/Decongestants: Flonase Other therapy:  none  Past NSAIDS:  Ibuprofen Past analgesics:  Tylenol Past abortive triptans:  none Past abortive ergotamine:  none Past muscle relaxants:  none Past anti-emetic:  none Past antihypertensive medications:  none Past antidepressant medications:  none Past anticonvulsant medications:  none Past anti-CGRP:  none Past vitamins/Herbal/Supplements:  none Past antihistamines/decongestants:  none Other past therapies:  none  Caffeine:  No Diet:  Tries to drink water.  No soda. Exercise:  Up until this year he has (injured ankle) Depression:  no; Anxiety:  no Other pain:  ankle Sleep hygiene:  OSA.  Improved since sieptoplasty/turnbinate reduction and CPAP Family history of headache:  No  CBC from 05/30/2018 was unremarkable.  PAST MEDICAL HISTORY: Past Medical History:  Diagnosis Date  . Headache    HX  MIGRAINES  . Sleep apnea     PAST SURGICAL HISTORY: Past Surgical History:  Procedure Laterality Date  . NASAL SEPTOPLASTY W/ TURBINOPLASTY Bilateral 06/08/2018   Procedure: NASAL SEPTOPLASTY WITH BILATERAL TURBINATE REDUCTION;  Surgeon: Drema HalonNewman, Christopher E, MD;  Location[facility] OR;  Service: ENT;  Laterality: Bilateral;  . REFRACTIVE SURGERY    . WISDOM TOOTH EXTRACTION      MEDICATIONS: Current Outpatient Medications on File Prior to Visit  Medication Sig Dispense Refill  . cephALEXin (KEFLEX) 500 MG capsule Take 1 capsule (500 mg total) by mouth 2 (two) times daily. 14 capsule 0  . fluticasone (FLONASE) 50 MCG/ACT nasal spray Place 1 spray into both nostrils daily as needed for allergies or rhinitis.    Marland Kitchen. gemfibrozil (LOPID) 600 MG tablet Take 600 mg by mouth 2 (two) times daily.    Marland Kitchen. HYDROcodone-acetaminophen (NORCO)  5-325 MG tablet Take 1-2 tablets by mouth every 6 (six) hours as needed for moderate pain. 14 tablet 0  . SUMAtriptan (IMITREX) 25 MG tablet Take 25 mg by mouth every 2 (two) hours as needed  for migraine. May repeat in 2 hours if headache persists or recurs.    . topiramate (TOPAMAX) 25 MG tablet Take 75 mg by mouth 2 (two) times daily.     No current facility-administered medications on file prior to visit.     ALLERGIES: No Known Allergies  FAMILY HISTORY: No family history of headaches  SOCIAL HISTORY: Social History   Socioeconomic History  . Marital status: Single    Spouse name: Not on file  . Number of children: Not on file  . Years of education: Not on file  . Highest education level: Not on file  Occupational History  . Not on file  Social Needs  . Financial resource strain: Not on file  . Food insecurity:    Worry: Not on file    Inability: Not on file  . Transportation needs:    Medical: Not on file    Non-medical: Not on file  Tobacco Use  . Smoking status: Never Smoker  . Smokeless tobacco: Never Used  Substance and Sexual Activity  . Alcohol use: Yes    Comment: SOCIAL  . Drug use: Never  . Sexual activity: Not on file  Lifestyle  . Physical activity:    Days per week: Not on file    Minutes per session: Not on file  . Stress: Not on file  Relationships  . Social connections:    Talks on phone: Not on file    Gets together: Not on file    Attends religious service: Not on file    Active member of club or organization: Not on file    Attends meetings of clubs or organizations: Not on file    Relationship status: Not on file  . Intimate partner violence:    Fear of current or ex partner: Not on file    Emotionally abused: Not on file    Physically abused: Not on file    Forced sexual activity: Not on file  Other Topics Concern  . Not on file  Social History Narrative  . Not on file    REVIEW OF SYSTEMS: Constitutional: No fevers, chills, or sweats, no generalized fatigue, change in appetite Eyes: No visual changes, double vision, eye pain Ear, nose and throat: No hearing loss, ear pain, nasal congestion, sore  throat Cardiovascular: No chest pain, palpitations Respiratory:  No shortness of breath at rest or with exertion, wheezes GastrointestinaI: No nausea, vomiting, diarrhea, abdominal pain, fecal incontinence Genitourinary:  No dysuria, urinary retention or frequency Musculoskeletal:  No neck pain, back pain Integumentary: No rash, pruritus, skin lesions Neurological: as above Psychiatric: No depression, insomnia, anxiety Endocrine: No palpitations, fatigue, diaphoresis, mood swings, change in appetite, change in weight, increased thirst Hematologic/Lymphatic:  No purpura, petechiae. Allergic/Immunologic: no itchy/runny eyes, nasal congestion, recent allergic reactions, rashes  PHYSICAL EXAM: Blood pressure 122/78, pulse 86, height 5\' 10"  (1.778 m), weight 251 lb (113.9 kg), SpO2 98 %. General: No acute distress.  Patient appears well-groomed.  Head:  Normocephalic/atraumatic Eyes:  fundi examined but not visualized Neck: supple, no paraspinal tenderness, full range of motion Back: No paraspinal tenderness Heart: regular rate and rhythm Lungs: Clear to auscultation bilaterally. Vascular: No carotid bruits. Neurological Exam: Mental status: alert and oriented to person, place, and time, recent  and remote memory intact, fund of knowledge intact, attention and concentration intact, speech fluent and not dysarthric, language intact. Cranial nerves: CN I: not tested CN II: pupils equal, round and reactive to light, visual fields intact CN III, IV, VI:  full range of motion, no nystagmus, no ptosis CN V: facial sensation intact CN VII: upper and lower face symmetric CN VIII: hearing intact CN IX, X: gag intact, uvula midline CN XI: sternocleidomastoid and trapezius muscles intact CN XII: tongue midline Bulk & Tone: normal, no fasciculations. Motor:  5/5 throughout  Sensation: temperature and vibration sensation intact. Deep Tendon Reflexes:  2+ throughout, toes downgoing.  Finger to  nose testing:  Without dysmetria.  Heel to shin:  Without dysmetria.  Gait:  Normal station and stride.  Romberg negative.  IMPRESSION: Migraine without aura, without status migrainosus, not intractable  PLAN: 1.  For preventative management, increase topiramate 200 mg at bedtime 2.  For abortive therapy, he will continue taking sumatriptan 50 mg at earliest onset of headache.  If he wakes up with a migraine, he will take sumatriptan 100 mg with naproxen 500 mg.  He will stop Excedrin. 3.  Limit use of pain relievers to no more than 2 days out of week to prevent risk of rebound or medication-overuse headache. 4.  Keep headache diary 5.  Exercise, hydration, caffeine cessation, sleep hygiene, monitor for and avoid triggers 6.  Consider:  magnesium citrate 400mg  daily, riboflavin 400mg  daily, and coenzyme Q10 100mg  three times daily 7.  Follow up in 4 months  Thank you for allowing me to take part in the care of this patient.  Shon Millet, DO  CC:  Renford Dills, MD

## 2018-09-05 ENCOUNTER — Ambulatory Visit: Payer: 59 | Admitting: Neurology

## 2018-09-05 ENCOUNTER — Encounter: Payer: Self-pay | Admitting: Neurology

## 2018-09-05 VITALS — BP 122/78 | HR 86 | Ht 70.0 in | Wt 251.0 lb

## 2018-09-05 DIAGNOSIS — G43009 Migraine without aura, not intractable, without status migrainosus: Secondary | ICD-10-CM | POA: Diagnosis not present

## 2018-09-05 MED ORDER — NAPROXEN 500 MG PO TABS: 500.0000 mg | ORAL_TABLET | Freq: Two times a day (BID) | ORAL | 3 refills | Status: DC | PRN

## 2018-09-05 MED ORDER — SUMATRIPTAN SUCCINATE 100 MG PO TABS: ORAL_TABLET | ORAL | 3 refills | Status: DC

## 2018-09-05 NOTE — Patient Instructions (Addendum)
Migraine Recommendations: 1.  We will increase topiramate to 100mg  at bedtime.   2.  I will prescribe you sumatriptan 100mg .  Take 1/2 tablet at earliest onset of headache.  May repeat dose once in 2 hours if needed.  Do not exceed two doses in 24 hours.  IF YOU WAKE UP WITH MIGRAINE, TAKE SUMATRIPTAN 100MG  (I FULL TABLET) WITH NAPROXEN 500MG .  MAY REPEAT DOSE ONCE IN 2 HOURS IF NEEDED. 3.  Limit use of pain relievers to no more than 2 days out of the week.  These medications include acetaminophen, ibuprofen, triptans and narcotics.  This will help reduce risk of rebound headaches. 4.  Be aware of common food triggers such as processed sweets, processed foods with nitrites (such as deli meat, hot dogs, sausages), foods with MSG, alcohol (such as wine), chocolate, certain cheeses, certain fruits (dried fruits, bananas, pineapple), vinegar, diet soda. 4.  Avoid caffeine 5.  Routine exercise 6.  Proper sleep hygiene 7.  Stay adequately hydrated with water 8.  Keep a headache diary. 9.  Maintain proper stress management. 10.  Do not skip meals. 11.  Consider supplements:  Magnesium citrate 400mg  to 600mg  daily, riboflavin 400mg , Coenzyme Q 10 100mg  three times daily 12. Stop Excedrin 13.  Follow up in 4 months.

## 2018-09-11 ENCOUNTER — Other Ambulatory Visit: Payer: Self-pay

## 2018-09-11 ENCOUNTER — Telehealth: Payer: Self-pay

## 2018-09-11 MED ORDER — TOPIRAMATE 100 MG PO TABS: 200.0000 mg | ORAL_TABLET | Freq: Every day | ORAL | 5 refills | Status: DC

## 2018-09-11 NOTE — Telephone Encounter (Signed)
Called and spoke with Pt. Advised him to use my chart for requests. Pt states he is having trouble with MyChart. Walked him through to help with sign on and gave him activation code. Pt states he continues to get an edit that user not recognized. I reset the code, he continued to get edit. I advised him to call the number on the MyChart page.  Pt was actually talking about topiramate not sumatriptan as he stated in his message. [facility] [place]

## 2018-09-11 NOTE — Telephone Encounter (Signed)
Pt sent the following question via [facility]'s website ( for non-medical questions ):  " I was in your office on 09/05/18 to see Dr. Everlena CooperJaffe. He was going to up my rx for Sumatriptan to 100 mg. He did not give me a new rx that day. My current rx from my family doctor is for (3) 25mg  twice a day. I use [facility] online for my prescriptions. Can you have that changed on the [facility] online? Or does he need to just do me a new prescription? We never really went over how he was going to have that changed for me."  Please advise the patient not to use the website for refills, medical questions as it is not always checked on a daily basis. Thanks!

## 2018-11-12 ENCOUNTER — Other Ambulatory Visit: Payer: Self-pay

## 2018-11-12 ENCOUNTER — Telehealth: Payer: Self-pay

## 2018-11-12 MED ORDER — NAPROXEN 500 MG PO TABS: 500.0000 mg | ORAL_TABLET | Freq: Two times a day (BID) | ORAL | 3 refills | Status: DC | PRN

## 2018-11-12 MED ORDER — TOPIRAMATE 200 MG PO TABS: 200.0000 mg | ORAL_TABLET | Freq: Every day | ORAL | 3 refills | Status: DC

## 2018-11-12 NOTE — Telephone Encounter (Signed)
Called and advised Pt I have sent both the topiramate and naproxen to [facility]. I advised him  He will need to activated his My Chart accout to send messages securely. Pt states he is unable to sign in. I gave him a new activation code, he will try again and call back with any problems.

## 2018-11-12 NOTE — Telephone Encounter (Signed)
Pt sent the following message via the [facility] website (for non medical questions):  " I have been trying to get my medications switched over to the online [facility] which I regularly use. They have been trying to call your office and unsuccessful. I am with Dr. Everlena Cooper. Could you please switch all my meds over to the online [facility]?

## 2018-11-14 ENCOUNTER — Telehealth: Payer: Self-pay | Admitting: Neurology

## 2018-11-14 NOTE — Telephone Encounter (Signed)
Rx was sent 11/12/18

## 2018-11-14 NOTE — Telephone Encounter (Signed)
Patient would like his topiramate sent [facility]

## 2018-11-14 NOTE — Telephone Encounter (Signed)
Called [facility] spoke with Feliz Beam to make sure the Rx was correct with them

## 2019-01-01 NOTE — Progress Notes (Signed)
NEUROLOGY FOLLOW UP OFFICE NOTE  Mark Braun [id number]  HISTORY OF PRESENT ILLNESS: Mark Braun is a 43 year old right-handed man with migraines and sleep apnea who follows up for migraines.  UPDATE: Last visit, topiramate was increased.  Taking magnesium and CoQ10.  Intensity:  moderate Duration:  Within an hour Frequency:  Once a month on average (no more than 4 in past 4 months) Frequency of abortive medication: 4 times in past 4 months Rescue therapy:  Sumatriptan  with naproxen  Current NSAIDS:  naproxen  Current analgesics:   None Current triptans: Sumatriptan  Current ergotamine:  none Current anti-emetic:  none Current muscle relaxants:  none Current anti-anxiolytic:  none Current sleep aide:  none Current Antihypertensive medications:  none Current Antidepressant medications:  none Current Anticonvulsant medications: Topiramate  twice daily Current anti-CGRP:  none Current Vitamins/Herbal/Supplements:  magnesium, CoQ10 Current Antihistamines/Decongestants: Flonase Other therapy:  none  Caffeine:  No Diet:  Increased water intake.  Lost weight. Exercise:  Up until this year he has (injured ankle) Depression:  no; Anxiety:  no Other pain:  ankle Sleep hygiene:  OSA.  Improved since sieptoplasty/turnbinate reduction and CPAP  HISTORY:  Onset: Mid 30s Location:  Right periorbital Quality:  pressure Initial intensity:  7-8/10.  He denies new headache, thunderclap headache  Aura:  none Prodrome:  none Postdrome:  none Associated symptoms: Photophobia, phonophobia, sometimes nausea.  He denies associated vomiting, visual disturbance, autonomic symptoms, unilateral numbness or weakness. Initial duration:  24 hours.  Sometimes in 30-40 minutes if sumatriptan taken early but sometimes wakes up with it.   Initial Frequency:  Once a week Initial Frequency of abortive medication: 1 day a week Triggers: Emotional stress (work-related)  Aggravating factors:  Laying down Relieving factors: Applying ice or pressure to above the eye, sitting up Activity:  Does not aggravate. Every once and a while when he gets up from bed in the morning, he feels a little dizzy, slight sense of movement for up to a minute.  He also hears "water" in his ear.  His PCP has never seen fluid on exam.   Decreased since septoplasty and turbinate reduction 3 or 4 months ago.  Prior to surgery, they were occurring 3 days a week.  Past NSAIDS:  Ibuprofen Past analgesics:  Tylenol, Excedrin Migraine Past abortive triptans:  none Past abortive ergotamine:  none Past muscle relaxants:  none Past anti-emetic:  none Past antihypertensive medications:  none Past antidepressant medications:  none Past anticonvulsant medications:  none Past anti-CGRP:  none Past vitamins/Herbal/Supplements:  none Past antihistamines/decongestants:  none Other past therapies:  none  Family history of headache:  No  PAST MEDICAL HISTORY: Past Medical History:  Diagnosis Date  . Headache    HX  MIGRAINES  . Sleep apnea     MEDICATIONS: Current Outpatient Medications on File Prior to Visit  Medication Sig Dispense Refill  . cephALEXin (KEFLEX) 500 MG capsule Take 1 capsule (500 mg total) by mouth 2 (two) times daily. 14 capsule 0  . fluticasone (FLONASE) 50 MCG/ACT nasal spray Place 1 spray into both nostrils daily as needed for allergies or rhinitis.    Marland Kitchen gemfibrozil (LOPID) 600 MG tablet Take 600 mg by mouth 2 (two) times daily.    Marland Kitchen HYDROcodone-acetaminophen (NORCO) 5-325 MG tablet Take 1-2 tablets by mouth every 6 (six) hours as needed for moderate pain. 14 tablet 0  . naproxen (NAPROSYN) 500 MG tablet Take 1 tablet (500 mg total) by mouth every 12 (twelve) hours  as needed. 20 tablet 3  . naproxen (NAPROSYN) 500 MG tablet Take 1 tablet (500 mg total) by mouth every 12 (twelve) hours as needed. 60 tablet 3  . SUMAtriptan (IMITREX) 100 MG tablet Take 1/2-1 tablet  earliest onset of migraine.  May repeat x1 in 2 hours if headache persists or recurs. 10 tablet 3  . topiramate (TOPAMAX) 100 MG tablet Take 2 tablets (200 mg total) by mouth at bedtime. 60 tablet 5  . topiramate (TOPAMAX) 100 MG tablet Take 2 tablets (200 mg total) by mouth at bedtime. 60 tablet 5  . topiramate (TOPAMAX) 200 MG tablet Take 1 tablet (200 mg total) by mouth at bedtime. 90 tablet 3  . topiramate (TOPAMAX) 25 MG tablet Take 75 mg by mouth 2 (two) times daily.     No current facility-administered medications on file prior to visit.     ALLERGIES: No Known Allergies  FAMILY HISTORY: Family History  Problem Relation Age of Onset  . Diabetes Mother   . Heart failure Father    SOCIAL HISTORY: Social History   Socioeconomic History  . Marital status: Single    Spouse name: Not on file  . Number of children: 2  . Years of education: Not on file  . Highest education level: Bachelor's degree (e.g., BA, AB, BS)  Occupational History  . Occupation: Investment banker, corporate: Physiological scientist of the [employer]  Social Needs  . Financial resource strain: Not on file  . Food insecurity:    Worry: Not on file    Inability: Not on file  . Transportation needs:    Medical: Not on file    Non-medical: Not on file  Tobacco Use  . Smoking status: Never Smoker  . Smokeless tobacco: Never Used  Substance and Sexual Activity  . Alcohol use: Yes    Comment: SOCIAL  . Drug use: Never  . Sexual activity: Not on file  Lifestyle  . Physical activity:    Days per week: Not on file    Minutes per session: Not on file  . Stress: Not on file  Relationships  . Social connections:    Talks on phone: Not on file    Gets together: Not on file    Attends religious service: Not on file    Active member of club or organization: Not on file    Attends meetings of clubs or organizations: Not on file    Relationship status: Not on file  . Intimate partner violence:    Fear of current or ex partner:  Not on file    Emotionally abused: Not on file    Physically abused: Not on file    Forced sexual activity: Not on file  Other Topics Concern  . Not on file  Social History Narrative   Patient is right-handed. He does not drink caffeine. He lives alone in a 2 story house. He has been unable to exercise since fracturing his ankle in April.     REVIEW OF SYSTEMS: Constitutional: No fevers, chills, or sweats, no generalized fatigue, change in appetite Eyes: No visual changes, double vision, eye pain Ear, nose and throat: No hearing loss, ear pain, nasal congestion, sore throat Cardiovascular: No chest pain, palpitations Respiratory:  No shortness of breath at rest or with exertion, wheezes GastrointestinaI: No nausea, vomiting, diarrhea, abdominal pain, fecal incontinence Genitourinary:  No dysuria, urinary retention or frequency Musculoskeletal:  No neck pain, back pain Integumentary: No rash, pruritus, skin lesions Neurological: as  above Psychiatric: No depression, insomnia, anxiety Endocrine: No palpitations, fatigue, diaphoresis, mood swings, change in appetite, change in weight, increased thirst Hematologic/Lymphatic:  No purpura, petechiae. Allergic/Immunologic: no itchy/runny eyes, nasal congestion, recent allergic reactions, rashes  PHYSICAL EXAM: Blood pressure 118/84, pulse 75, temperature 98.2 F (36.8 C), height 5\' 10"  (1.778 m), weight 215 lb (97.5 kg), SpO2 98 %. General: No acute distress.  Patient appears well-groomed.   Head:  Normocephalic/atraumatic Eyes:  Fundi examined but not visualized Neck: supple, no paraspinal tenderness, full range of motion Heart:  Regular rate and rhythm Lungs:  Clear to auscultation bilaterally Back: No paraspinal tenderness Neurological Exam: alert and oriented to person, place, and time. Attention span and concentration intact, recent and remote memory intact, fund of knowledge intact.  Speech fluent and not dysarthric, language  intact.  CN II-XII intact. Bulk and tone normal, muscle strength 5/5 throughout.  Sensation to light touch intact.  Deep tendon reflexes 2+ throughout, toes downgoing.  Finger to nose and heel to shin testing intact.  Gait normal, Romberg negative.  IMPRESSION: Migraine without aura, without status migrainosus, not intractable, stable  PLAN: 1.  For preventative management, continue topiramate 100mg  twice daily 2.  For abortive therapy, sumatriptan 100mg  with naproxen 500mg  3.  Limit use of pain relievers to no more than 2 days out of week to prevent risk of rebound or medication-overuse headache. 4.  Keep headache diary 5.  Exercise, hydration, caffeine cessation, sleep hygiene, monitor for and avoid triggers 6.  Continue  magnesium citrate 400mg  daily and coenzyme Q10 100mg  three times daily 7.  Follow up in 9 months  Shon Millet, DO  CC: Renford Dills, MD

## 2019-01-02 ENCOUNTER — Other Ambulatory Visit: Payer: Self-pay

## 2019-01-02 ENCOUNTER — Encounter: Payer: Self-pay | Admitting: Neurology

## 2019-01-02 ENCOUNTER — Ambulatory Visit: Payer: 59 | Admitting: Neurology

## 2019-01-02 VITALS — BP 118/84 | HR 75 | Temp 98.2°F | Ht 70.0 in | Wt 215.0 lb

## 2019-01-02 DIAGNOSIS — G43009 Migraine without aura, not intractable, without status migrainosus: Secondary | ICD-10-CM

## 2019-01-02 MED ORDER — TOPIRAMATE 100 MG PO TABS: 100.0000 mg | ORAL_TABLET | Freq: Two times a day (BID) | ORAL | 3 refills | Status: DC

## 2019-01-02 NOTE — Patient Instructions (Signed)
1.  For preventative management, continue topiramate 100mg  twice daily (new 3 month prescription to [facility]) 2.  For abortive therapy, sumatriptan 100mg  with naproxen 500mg  as directed. 3.  Limit use of pain relievers to no more than 2 days out of week to prevent risk of rebound or medication-overuse headache. 4.  Keep headache diary 5.  Exercise, hydration, caffeine cessation, sleep hygiene, monitor for and avoid triggers 6.  Continue magnesium citrate 400mg  daily and coenzyme Q10 100mg  three times daily 7.  Follow up in 9 months

## 2019-02-27 ENCOUNTER — Other Ambulatory Visit: Payer: Self-pay | Admitting: Neurology

## 2019-06-22 ENCOUNTER — Other Ambulatory Visit: Payer: Self-pay | Admitting: Neurology

## 2019-10-01 ENCOUNTER — Encounter: Payer: Self-pay | Admitting: Neurology

## 2019-10-01 NOTE — Progress Notes (Signed)
Virtual Visit via Video Note The purpose of this virtual visit is to provide medical care while limiting exposure to the novel coronavirus.    Consent was obtained for video visit:  Yes.   Answered questions that patient had about telehealth interaction:  Yes.   I discussed the limitations, risks, security and privacy concerns of performing an evaluation and management service by telemedicine. I also discussed with the patient that there may be a patient responsible charge related to this service. The patient expressed understanding and agreed to proceed.  Pt location: Home Physician Location: Home Name of referring provider:  Renford DillsPolite, Ronald, MD I connected with Mark Braun Drennan at patients initiation/request on 10/02/2019 at  8:50 AM EST by video enabled telemedicine application and verified that I am speaking with the correct person using two identifiers. Pt MRN:  [id number] Pt DOB:  14-Mar-1976 Video Participants:  Mark Braun O[place]ola   History of Present Illness:  Mark Braun Mirza is a 43 year old right-handed man with migraines and sleep apnea who follows up for migraines.  UPDATE: Last visit, topiramate was increased.  Taking magnesium and CoQ10.  Intensity:  Moderate Duration:  Within an hour Frequency:  Once or sometimes twice a month on average Frequency of abortive medication: 4 times in past 4 months Rescue therapy:  naproxen first line, Sumatriptan 100mg  if severe Current NSAIDS:naproxen 500mg  Current analgesics: None Current triptans:Sumatriptan 100mg  Current ergotamine:none Current anti-emetic:none Current muscle relaxants:none Current anti-anxiolytic:none Current sleep aide:none Current Antihypertensive medications:none Current Antidepressant medications:none Current Anticonvulsant medications:Topiramate 100mg  twice daily Current anti-CGRP:none Current Vitamins/Herbal/Supplements:magnesium, CoQ10 Current  Antihistamines/Decongestants:Flonase Other therapy:none  Caffeine:No Diet:Increased water intake.  Lost weight. Exercise:Up until this year he has (injured ankle) Depression:no; Anxiety:no Other pain:ankle Sleep hygiene:OSA. Improved since sieptoplasty/turnbinate reduction and CPAP  HISTORY: Onset:  His mid-30s Location:Right periorbital Quality:pressure Initial intensity:7-8/10. Hedenies new headache, thunderclap headache  Aura:none Prodrome:none Postdrome:none Associated symptoms: Photophobia, phonophobia, sometimes nausea.Hedenies associated vomiting, visual disturbance, autonomic symptoms,unilateral numbness or weakness. Initial duration:24 hours. Sometimes in 30-40 minutes if sumatriptan taken early but sometimes wakes up with it.  Initial Frequency:Once a week Initial Frequency of abortive medication:1 day a week Triggers:  Emotional stress (work-related) Aggravating factors: Laying down Relieving factors:  Applying ice or pressure to above the eye, sitting up Activity:Does not aggravate. Every once and a while when he gets up from bed in the morning, he feels a little dizzy, slight sense of movement for up to a minute. He also hears "water" in his ear. His PCP has never seen fluid on exam.  Decreased since septoplasty and turbinate reduction 3 or 4 months ago. Prior to surgery, they were occurring 3 days a week.  Past NSAIDS:Ibuprofen Past analgesics:Tylenol, Excedrin Migraine Past abortive triptans:none Past abortive ergotamine:none Past muscle relaxants:none Past anti-emetic:none Past antihypertensive medications:none Past antidepressant medications:none Past anticonvulsant medications:none Past anti-CGRP:none Past vitamins/Herbal/Supplements:none Past antihistamines/decongestants:none Other past therapies:none  Family history of headache:No   Past Medical History: Past Medical  History:  Diagnosis Date  . Headache    HX  MIGRAINES  . Sleep apnea     Medications: Outpatient Encounter Medications as of 10/02/2019  Medication Sig  . fluticasone (FLONASE) 50 MCG/ACT nasal spray Place 1 spray into both nostrils daily as needed for allergies or rhinitis.  Marland Kitchen. gemfibrozil (LOPID) 600 MG tablet Take 600 mg by mouth 2 (two) times daily.  . naproxen (NAPROSYN) 500 MG tablet Take 1 tablet (500 mg total) by mouth every 12 (twelve) hours as needed.  . naproxen (NAPROSYN) 500 MG tablet  Take 1 tablet (500 mg total) by mouth every 12 (twelve) hours as needed.  . naproxen (NAPROSYN) 500 MG tablet TAKE 1 TABLET BY MOUTH  EVERY 12 HOURS AS NEEDED  . SUMAtriptan (IMITREX) 100 MG tablet TAKE ONE-HALF TO 1 TABLET  BY MOUTH AT EARLIEST ONSET  OF MIGRAINE. MAY REPEAT  ONCE IN 2 HOURS IF NEEDED.  MAX 2 TABS IN 24 HOURS.  Marland Kitchen topiramate (TOPAMAX) 100 MG tablet Take 1 tablet (100 mg total) by mouth 2 (two) times daily.   No facility-administered encounter medications on file as of 10/02/2019.    Allergies: No Known Allergies  Family History: Family History  Problem Relation Age of Onset  . Diabetes Mother   . Heart failure Father     Social History: Social History   Socioeconomic History  . Marital status: Single    Spouse name: Not on file  . Number of children: 2  . Years of education: Not on file  . Highest education level: Bachelor's degree (e.g., BA, AB, BS)  Occupational History  . Occupation: Scientist, clinical (histocompatibility and immunogenetics): Engineer, maintenance of the [employer]  . Smoking status: Never Smoker  . Smokeless tobacco: Never Used  Substance and Sexual Activity  . Alcohol use: Yes    Comment: SOCIAL  . Drug use: Never  . Sexual activity: Not on file  Other Topics Concern  . Not on file  Social History Narrative   Patient is right-handed. He does not drink caffeine. He lives alone in a 2 story house. He has been unable to exercise since fracturing his ankle in April.    Social  Determinants of Health   Financial Resource Strain:   . Difficulty of Paying Living Expenses: Not on file  Food Insecurity:   . Worried About Charity fundraiser in the Last Year: Not on file  . Ran Out of Food in the Last Year: Not on file  Transportation Needs:   . Lack of Transportation (Medical): Not on file  . Lack of Transportation (Non-Medical): Not on file  Physical Activity:   . Days of Exercise per Week: Not on file  . Minutes of Exercise per Session: Not on file  Stress:   . Feeling of Stress : Not on file  Social Connections:   . Frequency of Communication with Friends and Family: Not on file  . Frequency of Social Gatherings with Friends and Family: Not on file  . Attends Religious Services: Not on file  . Active Member of Clubs or Organizations: Not on file  . Attends Archivist Meetings: Not on file  . Marital Status: Not on file  Intimate Partner Violence:   . Fear of Current or Ex-Partner: Not on file  . Emotionally Abused: Not on file  . Physically Abused: Not on file  . Sexually Abused: Not on file    Observations/Objective:   Height 5\' 11"  (1.803 m), weight 220 lb (99.8 kg). No acute distress.  Alert and oriented.  Speech fluent and not dysarthric.  Language intact.  Eyes orthophoric on primary gaze.  Face symmetric.  Assessment and Plan:   Migraine without aura, without status migrainosus, not intractable  1.  For preventative management, topiramate 100mg  twice daily 2.  For abortive therapy, sumatriptan 100mg  with naproxen 500mg  3.  Limit use of pain relievers to no more than 2 days out of week to prevent risk of rebound or medication-overuse headache. 4.  Keep headache diary 5.  Exercise,  hydration, caffeine cessation, sleep hygiene, monitor for and avoid triggers 6.  Magnesium citrate 400mg  daily, riboflavin 400mg  daily, and coenzyme Q10 100mg  three times daily 7. Follow up 1 year   Follow Up Instructions:    -I discussed the assessment  and treatment plan with the patient. The patient was provided an opportunity to ask questions and all were answered. The patient agreed with the plan and demonstrated an understanding of the instructions.   The patient was advised to call back or seek an in-person evaluation if the symptoms worsen or if the condition fails to improve as anticipated.    , DO

## 2019-10-02 ENCOUNTER — Encounter: Payer: Self-pay | Admitting: Neurology

## 2019-10-02 ENCOUNTER — Telehealth (INDEPENDENT_AMBULATORY_CARE_PROVIDER_SITE_OTHER): Payer: No Typology Code available for payment source | Admitting: Neurology

## 2019-10-02 VITALS — Ht 71.0 in | Wt 220.0 lb

## 2019-10-02 DIAGNOSIS — G43009 Migraine without aura, not intractable, without status migrainosus: Secondary | ICD-10-CM | POA: Diagnosis not present

## 2019-11-24 ENCOUNTER — Other Ambulatory Visit: Payer: Self-pay | Admitting: Neurology

## 2019-12-27 ENCOUNTER — Other Ambulatory Visit: Payer: Self-pay | Admitting: Neurology

## 2020-02-27 IMAGING — CT CT MAXILLOFACIAL W/O CM
3 of 5 series · 12 of 47 positions shown, 14 images · non-contrast
Comparison: None.

CLINICAL DATA: Chronic sinusitis worsening for a year.

EXAM:
CT MAXILLOFACIAL WITHOUT CONTRAST
TECHNIQUE: Multidetector CT images of the paranasal sinuses were obtained using
the standard protocol without intravenous contrast.

[Series 2: sinus 2.00 hr60 s3 ax · axial · 0.43mm/px · z∈[-675,-577]mm · 6 of 63 slices shown, 8 images]
[im 7/63  brain]
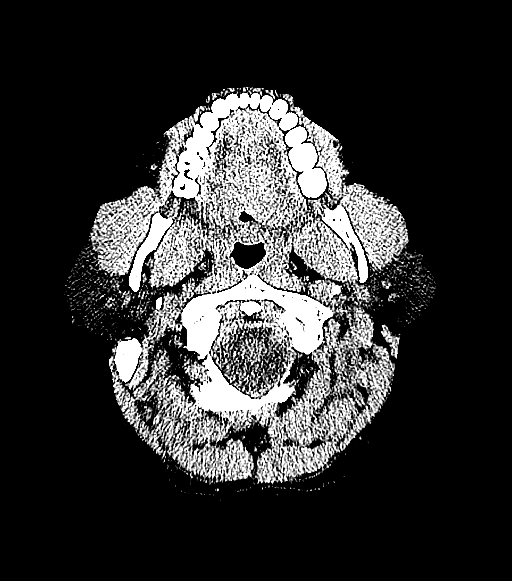
[im 7/63  bone]
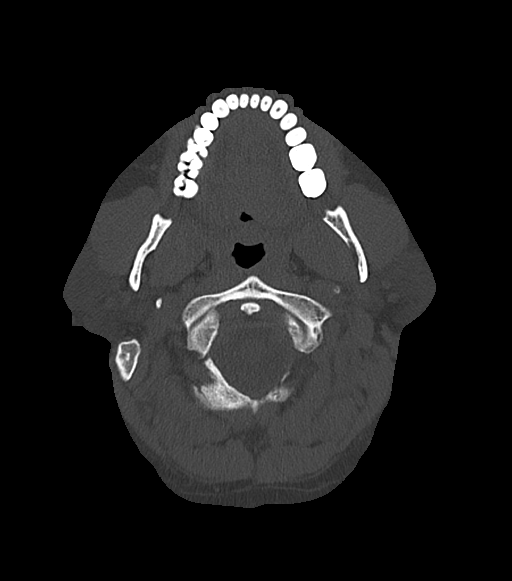
[im 21/63  bone]
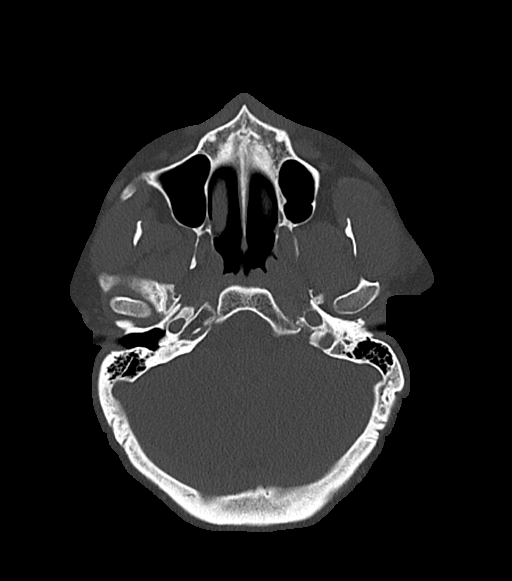
[im 28/63  bone]
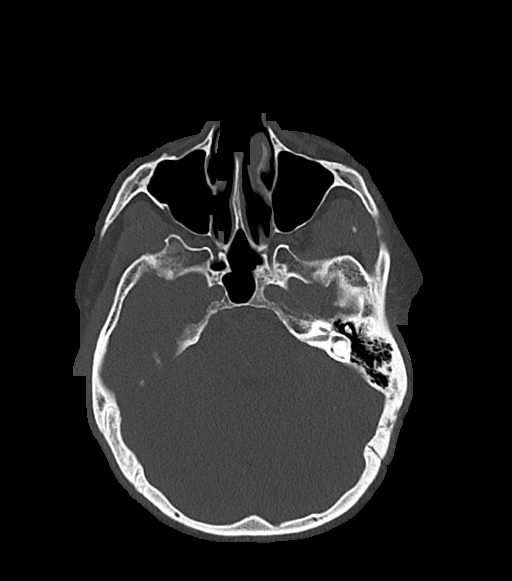
[im 35/63  bone]
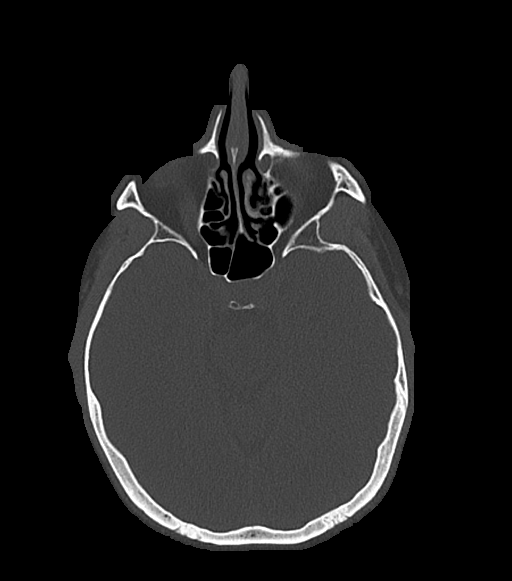
[im 49/63  brain]
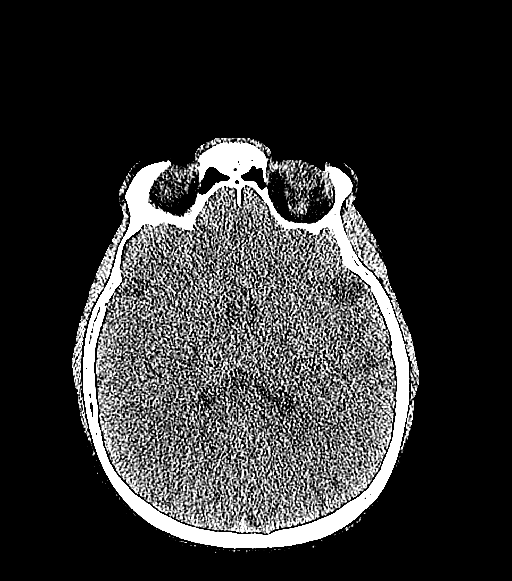
[im 49/63  bone]
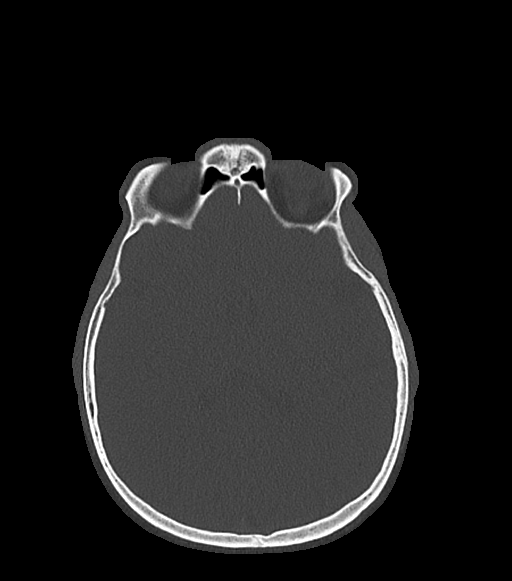
[im 56/63  bone]
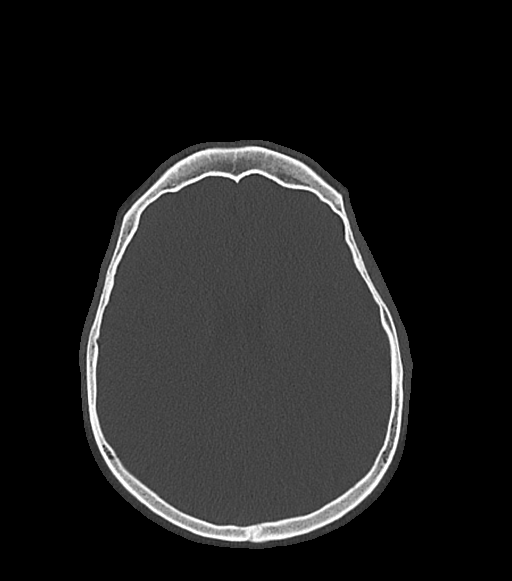

[Series 4: sinus 2.00 hr60 s3 cor · coronal · 0.25mm/px · 3 of 123 slices shown]
[im 41/123  bone]
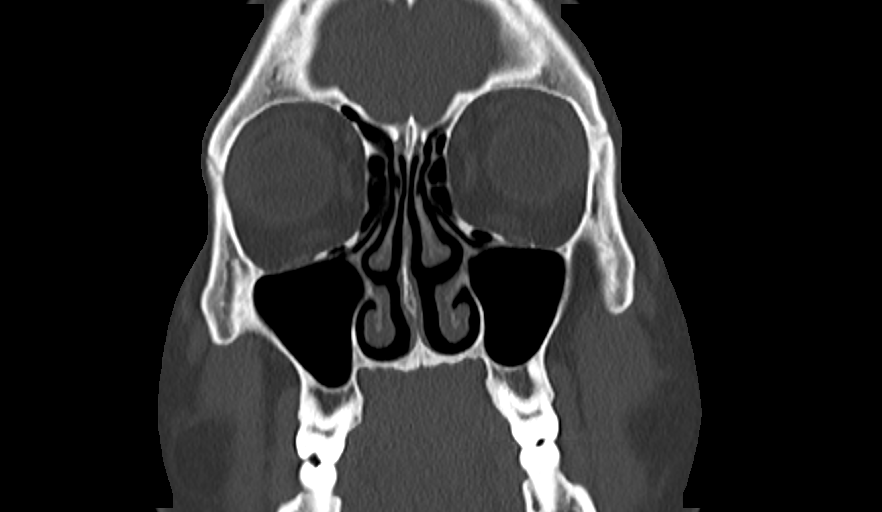
[im 55/123  bone]
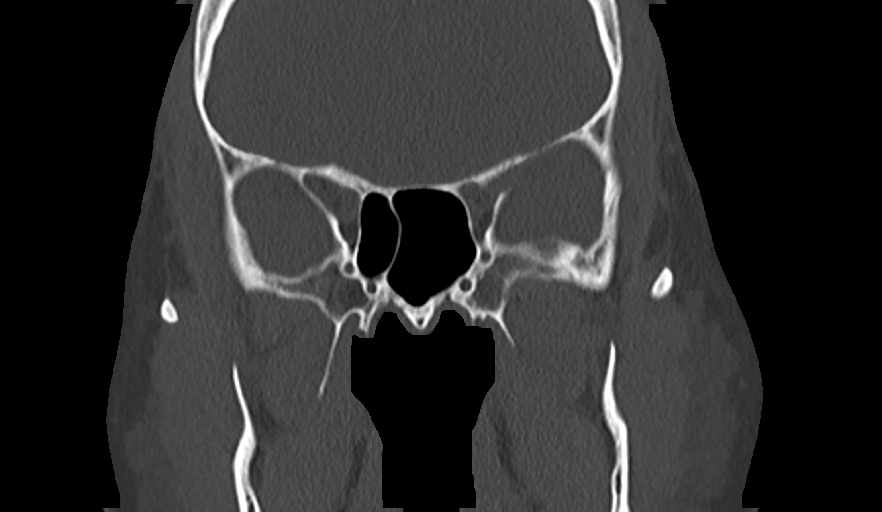
[im 68/123  bone]
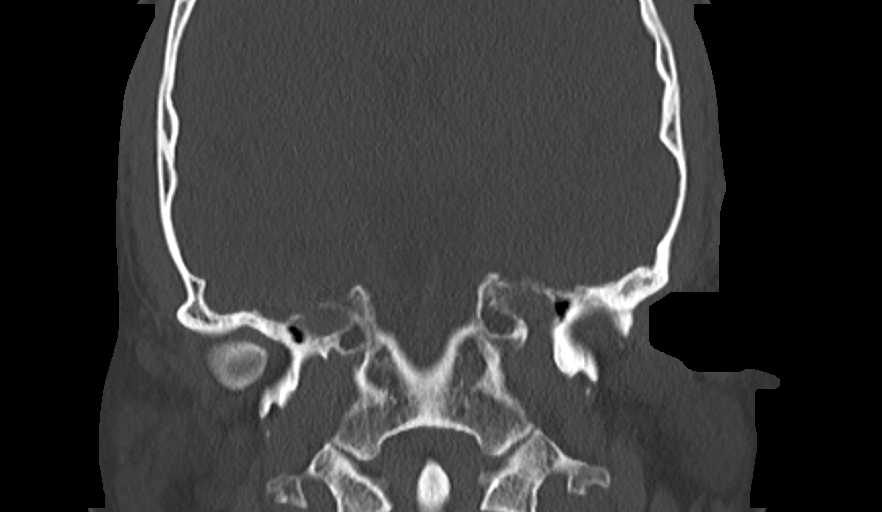

[Series 6: sinus 2.00 hr60 s3 sag · sagittal · 0.25mm/px · 3 of 109 slices shown]
[im 37/109  bone]
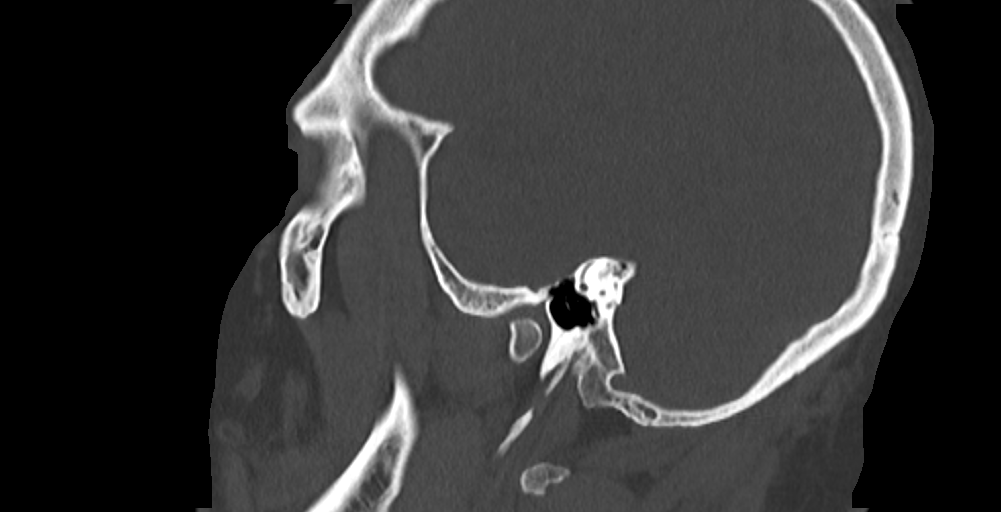
[im 55/109  bone]
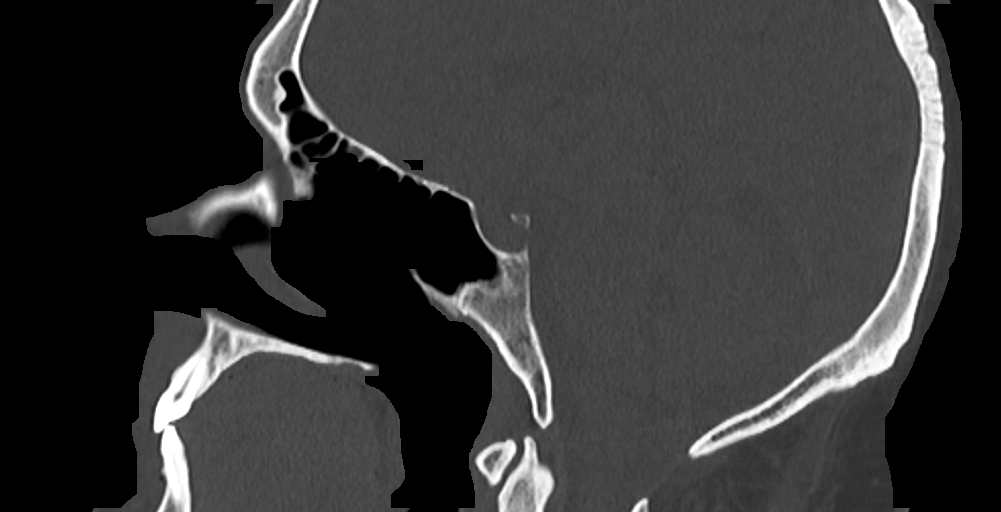
[im 73/109  bone]
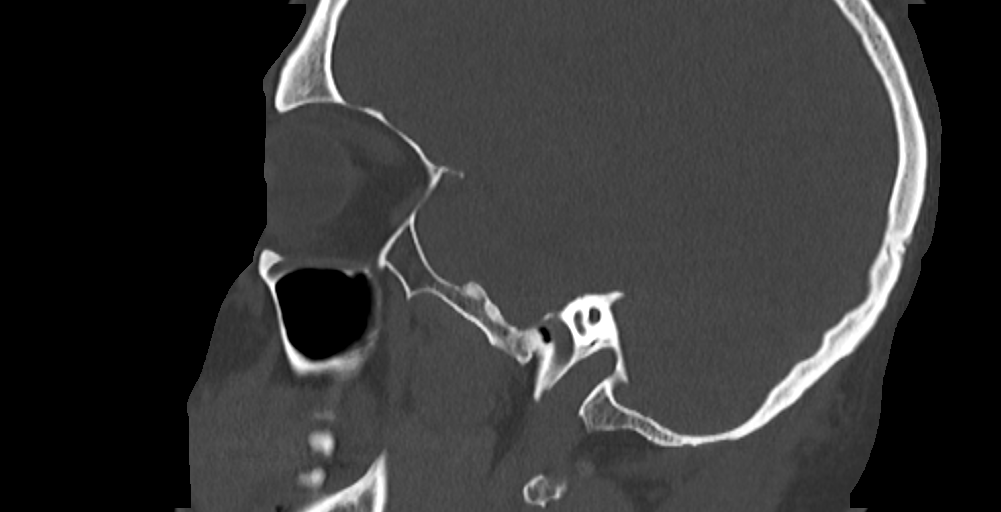

[12 of 47 positions shown; findings below may reference images not displayed]

FINDINGS: Paranasal sinuses:

Frontal: Normally aerated. Patent frontal sinus drainage pathways.

Ethmoid: Normally aerated.

Maxillary: Normally aerated.

Sphenoid: Normally aerated. Minimal mucosal thickening effaces RIGHT
sphenoid ethmoidal recess. LEFT is patent.

Right ostiomeatal unit: Patent.

Left ostiomeatal unit: Patent.

Nasal passages: Patent. Intact nasal septum is midline.

Anatomy: Pneumatization superior to anterior ethmoid notches.
Symmetric and intact olfactory grooves and fovea ethmoidalis, Keros
II (4-7mm). Sellar sphenoid pneumatization pattern. No dehiscence of
carotid or optic canals. No onodi cell.

Other: Orbits and intracranial compartment are unremarkable. Visible
mastoid air cells are normally aerated.
IMPRESSION: Normally aerated paranasal sinuses. Obstructed RIGHT sphenoid
ethmoidal recess.

## 2020-04-21 ENCOUNTER — Other Ambulatory Visit: Payer: Self-pay | Admitting: Neurology

## 2020-09-05 ENCOUNTER — Other Ambulatory Visit: Payer: Self-pay | Admitting: Neurology

## 2020-09-30 NOTE — Progress Notes (Signed)
NEUROLOGY FOLLOW UP OFFICE NOTE  Mark Braun [id number]   Subjective:  Mark Braun is a 44 year old right-handed man with migraines and sleep apnea who follows up for migraines.  UPDATE: He has noticed some increased headache frequency over past 3 to 4 months and not responding well to rescue therapy. Intensity:  Moderate Duration:  A few hours Frequency:  2 to 3 days a week Frequency of abortive medication:4 times in past 4 months Rescue therapy: naproxen first line - no longer effective, Sumatriptan 100mg  if severe Current NSAIDS:naproxen 500mg  Current analgesics:None Current triptans:Sumatriptan100mg  Current ergotamine:none Current anti-emetic:none Current muscle relaxants:none Current anti-anxiolytic:none Current sleep aide:none Current Antihypertensive medications:none Current Antidepressant medications:none Current Anticonvulsant medications:Topiramate100mg  twice daily Current anti-CGRP:none Current Vitamins/Herbal/Supplements:magnesium, CoQ10 Current Antihistamines/Decongestants:Flonase Other therapy:none  Caffeine:No Diet:Increased water intake. Not eating as well.  Weight gain Exercise:Not exercising as much now.   Depression:no; Anxiety:no Other pain:ankle Sleep hygiene:OSA. Improved since sieptoplasty/turnbinate reduction and CPAP  HISTORY: Onset:  His mid-30s Location:Right periorbital Quality:pressure Initial intensity:7-8/10. Hedenies new headache, thunderclap headache  Aura:none Prodrome:none Postdrome:none Associated symptoms: Photophobia, phonophobia, sometimes nausea.Hedenies associated vomiting, visual disturbance, autonomic symptoms,unilateral numbness or weakness. Initial duration:24 hours. Sometimes in 30-40 minutes if sumatriptan taken early but sometimes wakes up with it. InitialFrequency:Once a week InitialFrequency of abortive medication:1 day a  week Triggers:  Emotional stress (work-related) Aggravating factors: Laying down Relieving factors:  Applying iceor pressure to above the eye, sitting up Activity:Does not aggravate. Every once and a while when he gets up from bed in the morning, he feels a little dizzy, slight sense of movement for up to a minute. He also hears "water" in his ear. His PCP has never seen fluid on exam.  Decreased since septoplasty and turbinate reduction 3 or 4 months ago. Prior to surgery, they were occurring 3 days a week.  Past NSAIDS:Ibuprofen Past analgesics:Tylenol, Excedrin Migraine Past abortive triptans:none Past abortive ergotamine:none Past muscle relaxants:none Past anti-emetic:none Past antihypertensive medications:none Past antidepressant medications:none Past anticonvulsant medications:none Past anti-CGRP:none Past vitamins/Herbal/Supplements:none Past antihistamines/decongestants:none Other past therapies:none  Family history of headache:No  PAST MEDICAL HISTORY: Past Medical History:  Diagnosis Date  . Headache    HX  MIGRAINES  . Sleep apnea     MEDICATIONS: Current Outpatient Medications on File Prior to Visit  Medication Sig Dispense Refill  . fluticasone (FLONASE) 50 MCG/ACT nasal spray Place 1 spray into both nostrils daily as needed for allergies or rhinitis.    12-06-2000 gemfibrozil (LOPID) 600 MG tablet Take 600 mg by mouth 2 (two) times daily.    . naproxen (NAPROSYN) 500 MG tablet Take 1 tablet (500 mg total) by mouth every 12 (twelve) hours as needed. 20 tablet 3  . naproxen (NAPROSYN) 500 MG tablet Take 1 tablet (500 mg total) by mouth every 12 (twelve) hours as needed. 60 tablet 3  . naproxen (NAPROSYN) 500 MG tablet TAKE 1 TABLET BY MOUTH  EVERY 12 HOURS AS NEEDED 60 tablet 11  . SUMAtriptan (IMITREX) 100 MG tablet TAKE ONE-HALF TO 1 TABLET  BY MOUTH AT EARLIEST ONSET  OF MIGRAINE. MAY REPEAT  ONCE IN 2 HOURS IF NEEDED.  MAX 2 TABS  IN 24 HOURS. 30 tablet 0  . topiramate (TOPAMAX) 100 MG tablet TAKE 1 TABLET BY MOUTH  TWICE DAILY 180 tablet 3   No current facility-administered medications on file prior to visit.    ALLERGIES: No Known Allergies  FAMILY HISTORY: Family History  Problem Relation Age of Onset  . Diabetes Mother   .  Heart failure Father     SOCIAL HISTORY: Social History   Socioeconomic History  . Marital status: Single    Spouse name: Not on file  . Number of children: 2  . Years of education: Not on file  . Highest education level: Bachelor's degree (e.g., BA, AB, BS)  Occupational History  . Occupation: Investment banker, corporate: Physiological scientist of the [employer]  Tobacco Use  . Smoking status: Never Smoker  . Smokeless tobacco: Never Used  Vaping Use  . Vaping Use: Never used  Substance and Sexual Activity  . Alcohol use: Yes    Comment: SOCIAL  . Drug use: Never  . Sexual activity: Not on file  Other Topics Concern  . Not on file  Social History Narrative   Patient is right-handed. He does not drink caffeine. He lives alone in a 2 story house. He has been unable to exercise since fracturing his ankle in April.    Social Determinants of Health   Financial Resource Strain: Not on file  Food Insecurity: Not on file  Transportation Needs: Not on file  Physical Activity: Not on file  Stress: Not on file  Social Connections: Not on file  Intimate Partner Violence: Not on file     Objective:  Blood pressure 117/77, pulse 98, height 5\' 10"  (1.778 m), weight 234 lb 12.8 oz (106.5 kg). General: No acute distress.  Patient appears well-groomed.     Assessment/Plan:   Migraine without aura, without status migrainosus, not intractable, increased frequency and not responding to sumatriptan and naproxen  1.  Migraine prevention:  Start Aimovig 140mg  every 28 days.  Continue topiramate 100mg  twice daily 2.  Migraine rescue:  Rizatriptan 10mg .  Stop sumatriptan and naproxen. 3.  Limit use of  pain relievers to no more than 2 days out of week to prevent risk of rebound or medication-overuse headache. 4.   Continue CPAP, exercise, diet/weight loss, hydration 5.  Keep headache diary 5.  Follow up 6 months  , DO  CC: , MD

## 2020-10-01 ENCOUNTER — Ambulatory Visit (INDEPENDENT_AMBULATORY_CARE_PROVIDER_SITE_OTHER): Payer: No Typology Code available for payment source | Admitting: Neurology

## 2020-10-01 ENCOUNTER — Encounter: Payer: Self-pay | Admitting: Neurology

## 2020-10-01 ENCOUNTER — Other Ambulatory Visit: Payer: Self-pay

## 2020-10-01 VITALS — BP 117/77 | HR 98 | Ht 70.0 in | Wt 234.8 lb

## 2020-10-01 DIAGNOSIS — G43009 Migraine without aura, not intractable, without status migrainosus: Secondary | ICD-10-CM

## 2020-10-01 MED ORDER — RIZATRIPTAN BENZOATE 10 MG PO TABS: ORAL_TABLET | ORAL | 5 refills | Status: DC

## 2020-10-01 MED ORDER — AIMOVIG 140 MG/ML ~~LOC~~ SOAJ: 140.0000 mg | SUBCUTANEOUS | 5 refills | Status: DC

## 2020-10-01 NOTE — Patient Instructions (Signed)
  1. Start Aimovig 140mg  every 28 days.  Continue topiramate 100mg  twice daily 2. Take rizatriptan 10mg  at earliest onset of headache.  May repeat dose once in 2 hours if needed.  Maximum 2 tablets in 24 hours.  Stop sumatriptan and naproxen. 3. Limit use of pain relievers to no more than 2 days out of the week.  These medications include acetaminophen, NSAIDs (ibuprofen/Advil/Motrin, naproxen/Aleve, triptans (Imitrex/sumatriptan), Excedrin, and narcotics.  This will help reduce risk of rebound headaches. 4. Be aware of common food triggers:  - Caffeine:  coffee, black tea, cola, Mt. Dew  - Chocolate  - Dairy:  aged cheeses (brie, blue, cheddar, gouda, [place], provolone, [place], Swiss, etc), chocolate milk, buttermilk, sour cream, limit eggs and yogurt  - Nuts, peanut butter  - Alcohol  - Cereals/grains:  FRESH breads (fresh bagels, sourdough, doughnuts), yeast productions  - Processed/canned/aged/cured meats (pre-packaged deli meats, hotdogs)  - MSG/glutamate:  soy sauce, flavor enhancer, pickled/preserved/marinated foods  - Sweeteners:  aspartame (Equal, Nutrasweet).  Sugar and Splenda are okay  - Vegetables:  legumes (lima beans, lentils, snow peas, fava beans, pinto peans, peas, garbanzo beans), sauerkraut, onions, olives, pickles  - Fruit:  avocados, bananas, citrus fruit (orange, lemon, grapefruit), mango  - Other:  Frozen meals, macaroni and cheese 5. Routine exercise 6. Stay adequately hydrated (aim for 64 oz water daily) 7. Keep headache diary 8. Maintain proper stress management 9. Maintain proper sleep hygiene 10. Do not skip meals 11. Consider supplements:  magnesium citrate 400mg  daily, riboflavin 400mg  daily, coenzyme Q10 100mg  three times daily.

## 2020-10-06 ENCOUNTER — Encounter: Payer: Self-pay | Admitting: Neurology

## 2020-10-06 NOTE — Progress Notes (Addendum)
Mark Braun (Key: [id number]) Aimovig 140MG /ML auto-injectors   Form Rx (MRx) [employer] Prior Authorization Form 2017 Created 6 minutes ago Sent to Plan 4 minutes ago Plan Response 4 minutes ago Submit Clinical Questions 1 minute ago Determination Favorable 1 minute ago Message from 2018 request has been approved. Valid from 10/06/20 to 10/06/21.

## 2021-02-22 ENCOUNTER — Other Ambulatory Visit: Payer: Self-pay | Admitting: Neurology

## 2021-04-06 NOTE — Progress Notes (Signed)
NEUROLOGY FOLLOW UP OFFICE NOTE  Mark Braun [id number]  Assessment/Plan:   Migraine without aura  Migraine prevention:  Continue Aimovig 140mg  Q28 days.  Taper off topiramate.  If headaches increase, would restart it. Migraine rescue:  Continue rizatriptan as first line.  If migraine not aborted in 30 minutes, he will try either Nurtec or Ubrelvy.  He will contact me for prescription for whichever is more effective. Limit use of pain relievers to no more than 2 days out of week to prevent risk of rebound or medication-overuse headache. Keep headache diary Follow up 6 months.  Subjective:  Mark Braun is a 45 year old right-handed man with migraines and sleep apnea who follows up for migraines.   UPDATE: Started Aimovig in December. Reduced frequency.  Rizatriptan helps but takes longer. Intensity:  Moderate Duration:  20-30 minutes if rizatriptan (50%), 2 hours and 10 min after second pill (50%) Frequency:  1 a week on average, sometimes 2 days a week Frequency of abortive medication: 4 times in past 4 months Rescue therapy:  Rizatriptan 10mg  Current NSAIDS:  none Current analgesics:   None Current triptans: Maxalt 10mg  Current ergotamine:  none Current anti-emetic:  none Current muscle relaxants:  none Current anti-anxiolytic:  none Current sleep aide:  none Current Antihypertensive medications:  none Current Antidepressant medications:  none Current Anticonvulsant medications: Topiramate 100mg  twice daily Current anti-CGRP:  Aimovig 140mg  Current Vitamins/Herbal/Supplements:  magnesium, CoQ10 Current Antihistamines/Decongestants: Flonase Other therapy:  none   Caffeine:  No Diet:  Increased water intake.  Not eating as well.  Weight gain Exercise: Not exercising as much now.   Depression:  no; Anxiety:  no Other pain:  ankle Sleep hygiene:  OSA.  Improved since sieptoplasty/turnbinate reduction and CPAP   HISTORY: Onset:  His mid-30s Location:  Right  periorbital Quality:  pressure Initial intensity:  7-8/10.  He denies new headache, thunderclap headache Aura:  none Prodrome:  none Postdrome:  none Associated symptoms: Photophobia, phonophobia, sometimes nausea.  He denies associated vomiting, visual disturbance, autonomic symptoms, unilateral numbness or weakness. Initial duration:  24 hours.  Sometimes in 30-40 minutes if sumatriptan taken early but sometimes wakes up with it.   Initial Frequency:  Once a week Initial Frequency of abortive medication: 1 day a week Triggers:  Emotional stress (work-related) Aggravating factors:  Laying down Relieving factors:  Applying ice or pressure to above the eye, sitting up Activity:  Does not aggravate. Every once and a while when he gets up from bed in the morning, he feels a little dizzy, slight sense of movement for up to a minute.  He also hears "water" in his ear.  His PCP has never seen fluid on exam.   Decreased since septoplasty and turbinate reduction 3 or 4 months ago.  Prior to surgery, they were occurring 3 days a week.   Past NSAIDS:  Ibuprofen, naproxen Past analgesics:  Tylenol, Excedrin Migraine Past abortive triptans:  sumatriptan 100mg  Past abortive ergotamine:  none Past muscle relaxants:  none Past anti-emetic:  none Past antihypertensive medications:  none Past antidepressant medications:  none Past anticonvulsant medications:  none Past anti-CGRP:  none Past vitamins/Herbal/Supplements:  none Past antihistamines/decongestants:  none Other past therapies:  none   Family history of headache:  No  PAST MEDICAL HISTORY: Past Medical History:  Diagnosis Date   Headache    HX  MIGRAINES   Sleep apnea     MEDICATIONS: Current Outpatient Medications on File Prior to Visit  Medication Sig Dispense Refill  AIMOVIG 140 MG/ML SOAJ INJECT 140 MG INTO THE SKIN EVERY 28 (TWENTY-EIGHT) DAYS. 1 mL 0   fluticasone (FLONASE) 50 MCG/ACT nasal spray Place 1 spray into both  nostrils daily as needed for allergies or rhinitis.     gemfibrozil (LOPID) 600 MG tablet Take 600 mg by mouth 2 (two) times daily.     naproxen (NAPROSYN) 500 MG tablet Take 1 tablet (500 mg total) by mouth every 12 (twelve) hours as needed. (Patient not taking: Reported on 10/01/2020) 20 tablet 3   naproxen (NAPROSYN) 500 MG tablet Take 1 tablet (500 mg total) by mouth every 12 (twelve) hours as needed. (Patient not taking: Reported on 10/01/2020) 60 tablet 3   rizatriptan (MAXALT) 10 MG tablet TAKE 1 TABLET BY MOUTH AT EARLIEST ONSET OF MIGRAINE.  MAY REPEAT IN 2 HOURS IF NEEDED.  MAXIMUM 2 TABLETS IN 24 HOURS. 30 tablet 0   topiramate (TOPAMAX) 100 MG tablet TAKE 1 TABLET BY MOUTH  TWICE DAILY 180 tablet 3   No current facility-administered medications on file prior to visit.    ALLERGIES: No Known Allergies  FAMILY HISTORY: Family History  Problem Relation Age of Onset   Diabetes Mother    Heart failure Father       Objective:  Blood pressure 108/74, pulse 85, height 5\' 10"  (1.778 m), weight 228 lb (103.4 kg), SpO2 96 %. General: No acute distress.  Patient appears well-groomed.   Head:  Normocephalic/atraumatic Eyes:  Fundi examined but not visualized Neck: supple, no paraspinal tenderness, full range of motion Heart:  Regular rate and rhythm Lungs:  Clear to auscultation bilaterally Back: No paraspinal tenderness Neurological Exam: alert and oriented to person, place, and time.  Speech fluent and not dysarthric, language intact.  CN II-XII intact. Bulk and tone normal, muscle strength 5/5 throughout.  Sensation to light touch intact.  Deep tendon reflexes 2+ throughout, toes downgoing.  Finger to nose testing intact.  Gait normal, Romberg negative.   , DO  CC: Shon Millet, MD

## 2021-04-07 ENCOUNTER — Encounter: Payer: Self-pay | Admitting: Neurology

## 2021-04-07 ENCOUNTER — Other Ambulatory Visit: Payer: Self-pay

## 2021-04-07 ENCOUNTER — Ambulatory Visit (INDEPENDENT_AMBULATORY_CARE_PROVIDER_SITE_OTHER): Payer: No Typology Code available for payment source | Admitting: Neurology

## 2021-04-07 ENCOUNTER — Other Ambulatory Visit: Payer: Self-pay | Admitting: Neurology

## 2021-04-07 VITALS — BP 108/74 | HR 85 | Ht 70.0 in | Wt 228.0 lb

## 2021-04-07 DIAGNOSIS — G43009 Migraine without aura, not intractable, without status migrainosus: Secondary | ICD-10-CM

## 2021-04-07 NOTE — Patient Instructions (Signed)
Continue Aimovig 140mg  every 28 days  Taper off of topiramate - 1/2 tablet twice daily for one week, then 1/2 tablet at bedtime for one week, then STOP.  If headache frequency increases, contact me and we can restart it. If headache doesn't break 30 minutes after rizatriptan than try Nurtec (maximum 1 in 24 hours).  The next time you have a migraine and rizatriptan doesn't work after 30 minutes, try .  You may repeat in 2 hours (maximum 2 in 24 hours).  Contact me for a prescription of whichever rescue you prefer. Limit use of pain relievers to no more than 2 days out of week to prevent risk of rebound or medication-overuse headache. Keep headache diary  Follow up 6 months, may be virtual visit.

## 2021-04-26 ENCOUNTER — Other Ambulatory Visit: Payer: Self-pay

## 2021-04-26 DIAGNOSIS — R202 Paresthesia of skin: Secondary | ICD-10-CM

## 2021-04-28 ENCOUNTER — Other Ambulatory Visit: Payer: Self-pay | Admitting: Neurology

## 2021-04-28 MED ORDER — TRUDHESA 0.725 MG/ACT NA AERS: 1.0000 | INHALATION_SPRAY | NASAL | 5 refills | Status: DC | PRN

## 2021-04-28 NOTE — Progress Notes (Signed)
Contact plan to follow up on [id number], sent to plan dob 1976-09-13

## 2021-05-12 NOTE — Progress Notes (Signed)
//  Trudhesa approved 07/132022 to 04/28/2022.sent to scan.

## 2021-06-08 ENCOUNTER — Other Ambulatory Visit: Payer: Self-pay | Admitting: Neurology

## 2021-06-29 ENCOUNTER — Other Ambulatory Visit: Payer: Self-pay

## 2021-06-29 ENCOUNTER — Other Ambulatory Visit: Payer: Self-pay | Admitting: Neurology

## 2021-06-29 MED ORDER — RIZATRIPTAN BENZOATE 10 MG PO TABS: ORAL_TABLET | ORAL | 1 refills | Status: DC

## 2021-06-29 NOTE — Telephone Encounter (Signed)
Per pt need have his Rizatriptan sent to the mail in pharmacy [facility].    New script sent.

## 2021-06-30 ENCOUNTER — Other Ambulatory Visit: Payer: Self-pay

## 2021-06-30 MED ORDER — RIZATRIPTAN BENZOATE 10 MG PO TABS: ORAL_TABLET | ORAL | 1 refills | Status: DC

## 2021-06-30 NOTE — Telephone Encounter (Signed)
Per pt pharmacy never received script please resend to [phone].  Resent script to fax number given.

## 2021-07-01 ENCOUNTER — Encounter: Payer: Self-pay | Admitting: Neurology

## 2021-08-09 ENCOUNTER — Other Ambulatory Visit: Payer: Self-pay | Admitting: Neurology

## 2021-10-06 ENCOUNTER — Telehealth: Payer: No Typology Code available for payment source | Admitting: Neurology

## 2021-10-12 NOTE — Progress Notes (Signed)
Virtual Visit via Video Note The purpose of this virtual visit is to provide medical care while limiting exposure to the novel coronavirus.    Consent was obtained for video visit:  Yes.   Answered questions that patient had about telehealth interaction:  Yes.   I discussed the limitations, risks, security and privacy concerns of performing an evaluation and management service by telemedicine. I also discussed with the patient that there may be a patient responsible charge related to this service. The patient expressed understanding and agreed to proceed.  Pt location: Home Physician Location: office Name of referring provider:  Seward Carol, MD I connected with Mark Braun at patients initiation/request on 10/13/2021 at  8:30 AM EST by video enabled telemedicine application and verified that I am speaking with the correct person using two identifiers. Pt MRN:  [id number] Pt DOB:  November 23, 1975 Video Participants:  Mark Braun  Assessment and Plan:   Migraine without aura, without status migrainosus, not intractable  Migraine prevention:  Aimovig 140mg .   Migraine rescue:  rizatriptan 10mg  Limit use of pain relievers to no more than 2 days out of week to prevent risk of rebound or medication-overuse headache. Keep headache diary Follow up 1 year.   History of Present Illness:  Mark Braun is a 45 year old right-handed man with migraines and sleep apnea who follows up for migraines.   UPDATE: Tapered off of topiramate in June. Neysa Hotter initially worked but then became ineffective.  Chiropractor working on neck which helped.  Rizatriptan effective again.  Nurtec and Roselyn Meier were not effective.  Intensity:  Moderate Duration:  20-30 minutes if rizatriptan (50%), 2 hours and 10 min after second pill (50%) Frequency:  1 to 2 in 30 days Rescue therapy:  Rizatriptan 10mg  Current NSAIDS:  none Current analgesics:   None Current triptans: Maxalt 10mg  Current ergotamine:   none Current anti-emetic:  none Current muscle relaxants:  none Current anti-anxiolytic:  none Current sleep aide:  none Current Antihypertensive medications:  none Current Antidepressant medications:  none Current Anticonvulsant medications: none Current anti-CGRP:  Aimovig 140mg  Current Vitamins/Herbal/Supplements:  magnesium, CoQ10 Current Antihistamines/Decongestants: Flonase Other therapy:  chiropractic   Caffeine:  No Diet:  Increased water intake.  Not eating as well.  Weight gain Exercise: Not exercising as much now.   Depression:  no; Anxiety:  no Other pain:  ankle Sleep hygiene:  OSA.  Improved since sieptoplasty/turnbinate reduction and CPAP   HISTORY: Onset:  His mid-30s Location:  Right periorbital Quality:  pressure Initial intensity:  7-8/10.  He denies new headache, thunderclap headache Aura:  none Prodrome:  none Postdrome:  none Associated symptoms: Photophobia, phonophobia, sometimes nausea.  He denies associated vomiting, visual disturbance, autonomic symptoms, unilateral numbness or weakness. Initial duration:  24 hours.  Sometimes in 30-40 minutes if sumatriptan taken early but sometimes wakes up with it.   Initial Frequency:  Once a week Initial Frequency of abortive medication: 1 day a week Triggers:  Emotional stress (work-related) Aggravating factors:  Laying down Relieving factors:  Applying ice or pressure to above the eye, sitting up Activity:  Does not aggravate. Every once and a while when he gets up from bed in the morning, he feels a little dizzy, slight sense of movement for up to a minute.  He also hears "water" in his ear.  His PCP has never seen fluid on exam.   Decreased since septoplasty and turbinate reduction 3 or 4 months ago.  Prior to surgery, they were occurring 3 days  a week.   Past NSAIDS:  Ibuprofen, naproxen Past analgesics:  Tylenol, Excedrin Migraine Past abortive triptans:  sumatriptan 100mg  Past abortive ergotamine:   Trudhesa NS Past muscle relaxants:  none Past anti-emetic:  none Past antihypertensive medications:  none Past antidepressant medications:  none Past anticonvulsant medications:  Topiramate 100mg  twice daily Past anti-CGRP:  Nurtec (rescue), Ubrelvy 100mg  Past vitamins/Herbal/Supplements:  none Past antihistamines/decongestants:  none Other past therapies:  none   Family history of headache:  No  Past Medical History: Past Medical History:  Diagnosis Date   Headache    HX  MIGRAINES   Sleep apnea     Medications: Outpatient Encounter Medications as of 10/13/2021  Medication Sig   AIMOVIG 140 MG/ML SOAJ INJECT 140 MG INTO THE SKIN EVERY 28 (TWENTY-EIGHT) DAYS.   fluticasone (FLONASE) 50 MCG/ACT nasal spray Place 1 spray into both nostrils daily as needed for allergies or rhinitis.   gemfibrozil (LOPID) 600 MG tablet Take 600 mg by mouth 2 (two) times daily.   naproxen (NAPROSYN) 500 MG tablet Take 1 tablet (500 mg total) by mouth every 12 (twelve) hours as needed. (Patient not taking: No sig reported)   naproxen (NAPROSYN) 500 MG tablet Take 1 tablet (500 mg total) by mouth every 12 (twelve) hours as needed. (Patient not taking: No sig reported)   rizatriptan (MAXALT) 10 MG tablet TAKE 1 TABLET BY MOUTH AT EARLIEST ONSET OF MIGRAINE.  MAY REPEAT IN 2 HOURS IF NEEDED.  MAXIMUM 2 TABLETS IN 24 HOURS.   topiramate (TOPAMAX) 100 MG tablet TAKE 1 TABLET BY MOUTH  TWICE DAILY   No facility-administered encounter medications on file as of 10/13/2021.    Allergies: No Known Allergies  Family History: Family History  Problem Relation Age of Onset   Diabetes Mother    Heart failure Father     Observations/Objective:   There were no vitals taken for this visit. No acute distress.  Alert and oriented.  Speech fluent and not dysarthric.  Language intact.  Eyes orthophoric on primary gaze.  Face symmetric.   Follow Up Instructions:    -I discussed the assessment and treatment  plan with the patient. The patient was provided an opportunity to ask questions and all were answered. The patient agreed with the plan and demonstrated an understanding of the instructions.   The patient was advised to call back or seek an in-person evaluation if the symptoms worsen or if the condition fails to improve as anticipated.   , DO

## 2021-10-13 ENCOUNTER — Encounter: Payer: Self-pay | Admitting: Neurology

## 2021-10-13 ENCOUNTER — Telehealth (INDEPENDENT_AMBULATORY_CARE_PROVIDER_SITE_OTHER): Payer: No Typology Code available for payment source | Admitting: Neurology

## 2021-10-13 ENCOUNTER — Other Ambulatory Visit: Payer: Self-pay

## 2021-10-13 DIAGNOSIS — G43009 Migraine without aura, not intractable, without status migrainosus: Secondary | ICD-10-CM | POA: Diagnosis not present

## 2021-10-13 MED ORDER — AIMOVIG 140 MG/ML ~~LOC~~ SOAJ: 140.0000 mg | SUBCUTANEOUS | 2 refills | Status: DC

## 2021-10-13 MED ORDER — RIZATRIPTAN BENZOATE 10 MG PO TABS: ORAL_TABLET | ORAL | 7 refills | Status: DC

## 2021-10-19 ENCOUNTER — Telehealth: Payer: Self-pay

## 2021-10-19 NOTE — Telephone Encounter (Signed)
New message   Your information has been sent to [facility].  Naithen Madrid (Key: [id number]) Aimovig 140MG /ML auto-injectors   Form [facility] (MRx) Cytogeneticist Prior Authorization Form 2017 Created 6 minutes ago Sent to Plan 3 minutes ago Plan Response 3 minutes ago Submit Clinical Questions less than a minute ago Determination Wait for Determination Please wait for [employer] 2017 4 Part to return a determination.

## 2021-10-19 NOTE — Telephone Encounter (Signed)
F/u  Mark Braun (Key: [id number]) Aimovig 140MG /ML auto-injectors   Form Rx (MRx) [employer] Prior Authorization Form 2017 Created 7 minutes ago Sent to Plan 4 minutes ago Plan Response 4 minutes ago Submit Clinical Questions 2 minutes ago Determination Favorable 1 minute ago Message from 2018 PA has been Approved. PA End Date: 10/19/2022

## 2021-12-08 ENCOUNTER — Other Ambulatory Visit: Payer: Self-pay | Admitting: Neurology

## 2022-02-14 ENCOUNTER — Other Ambulatory Visit: Payer: Self-pay | Admitting: Neurology

## 2022-07-21 ENCOUNTER — Other Ambulatory Visit: Payer: Self-pay

## 2022-07-21 ENCOUNTER — Other Ambulatory Visit: Payer: Self-pay | Admitting: Neurology

## 2022-07-21 MED ORDER — RIZATRIPTAN BENZOATE 10 MG PO TABS: ORAL_TABLET | ORAL | 2 refills | Status: DC

## 2022-07-21 MED ORDER — AIMOVIG 140 MG/ML ~~LOC~~ SOAJ: SUBCUTANEOUS | 0 refills | Status: DC

## 2022-07-21 NOTE — Progress Notes (Addendum)
Refill request received aimovig 140 mg and rizatriptan 10 mg refill from [facility]

## 2022-07-21 NOTE — Addendum Note (Signed)
Addended by: Venetia Night on: 07/21/2022 11:31 AM   Modules accepted: Orders

## 2022-08-02 ENCOUNTER — Telehealth: Payer: Self-pay

## 2022-08-02 DIAGNOSIS — G43009 Migraine without aura, not intractable, without status migrainosus: Secondary | ICD-10-CM

## 2022-08-02 MED ORDER — AIMOVIG 140 MG/ML ~~LOC~~ SOAJ: SUBCUTANEOUS | 0 refills | Status: DC

## 2022-08-02 NOTE — Telephone Encounter (Signed)
[facility], Patient needs a PA for Aimovig and ICD code added.   Pa team please start a PA for Aimovig 140 mg

## 2022-08-03 ENCOUNTER — Other Ambulatory Visit (HOSPITAL_COMMUNITY): Payer: Self-pay

## 2022-08-03 ENCOUNTER — Telehealth: Payer: Self-pay

## 2022-08-03 NOTE — Telephone Encounter (Signed)
PA initiated and awaiting determination for Aimovig.

## 2022-08-03 NOTE — Telephone Encounter (Signed)
PA submitted to [facility] through [facility] for [place]. Awaiting determination.   YPP:[id number]

## 2022-08-05 ENCOUNTER — Other Ambulatory Visit (HOSPITAL_COMMUNITY): Payer: Self-pay

## 2022-08-05 NOTE — Telephone Encounter (Signed)
[facility] left a message stating the Aimovig needs prior authorization.

## 2022-08-08 ENCOUNTER — Other Ambulatory Visit (HOSPITAL_COMMUNITY): Payer: Self-pay

## 2022-08-09 ENCOUNTER — Other Ambulatory Visit (HOSPITAL_COMMUNITY): Payer: Self-pay

## 2022-08-11 ENCOUNTER — Other Ambulatory Visit (HOSPITAL_COMMUNITY): Payer: Self-pay

## 2022-08-11 NOTE — Telephone Encounter (Signed)
PA submitted was cancelled due to [facility] does not handle prescription benefit.  [employer] and they have prior authorization on file that expires 10/19/2022. No PA needed at this time  If this claim were submitted today by the pharmacy, it would reject for Early Refill. The dispensing pharmacy can contact our pharmacy help desk at [phone] for assistance with an override, if needed.  Per pharmacy records, patient has been filling Aimovig through [facility]- they last filled on 07/14/22 for 3 month supply.

## 2022-08-11 NOTE — Telephone Encounter (Signed)
PA submitted was cancelled due to [facility] does not handle prescription benefit.  [employer] and they have prior authorization on file that expires 10/19/2022. No PA needed at this time.  Per pharmacy records, patient has been filling Aimovig through [facility]- they last filled on 07/14/22 for 3 month supply.   Patient has never filled [place] with [facility].  Please send Refills to [facility].

## 2022-09-26 NOTE — Progress Notes (Unsigned)
NEUROLOGY FOLLOW UP OFFICE NOTE  Mark Braun [id number]  Assessment/Plan:   Migraine without aura, without status migrainosus, not intractable   Migraine prevention:  Aimovig 140mg .   Migraine rescue:  rizatriptan 10mg  Limit use of pain relievers to no more than 2 days out of week to prevent risk of rebound or medication-overuse headache. Keep headache diary Follow up 1 year.  Subjective:  Mark Braun is a 46 year old right-handed man with migraines and sleep apnea who follows up for migraines.   UPDATE: Intensity:  Moderate Duration:  20-30 minutes if rizatriptan (50%), 2 hours and 10 min after second pill (50%) Frequency:  1 to 3 a month Rescue therapy:  Rizatriptan 10mg  Current NSAIDS:  none Current analgesics:   None Current triptans: Maxalt 10mg  Current ergotamine:  none Current anti-emetic:  none Current muscle relaxants:  none Current anti-anxiolytic:  none Current sleep aide:  none Current Antihypertensive medications:  none Current Antidepressant medications:  none Current Anticonvulsant medications: none Current anti-CGRP:  Aimovig 140mg  Current Vitamins/Herbal/Supplements:  magnesium, CoQ10 Current Antihistamines/Decongestants: Flonase Other therapy:  chiropractic   Caffeine:  No Diet:  Increased water intake.  Not eating as well.  Weight gain Exercise: Not exercising as much now.   Depression:  no; Anxiety:  no Other pain:  ankle Sleep hygiene:  OSA.  Improved since sieptoplasty/turnbinate reduction and CPAP   HISTORY: Onset:  His mid-30s Location:  Right periorbital Quality:  pressure Initial intensity:  7-8/10.  He denies new headache, thunderclap headache Aura:  none Prodrome:  none Postdrome:  none Associated symptoms: Photophobia, phonophobia, sometimes nausea.  He denies associated vomiting, visual disturbance, autonomic symptoms, unilateral numbness or weakness. Initial duration:  24 hours.  Sometimes in 30-40 minutes if sumatriptan  taken early but sometimes wakes up with it.   Initial Frequency:  Once a week Initial Frequency of abortive medication: 1 day a week Triggers:  Emotional stress (work-related) Aggravating factors:  Laying down Relieving factors:  Applying ice or pressure to above the eye, sitting up Activity:  Does not aggravate. Every once and a while when he gets up from bed in the morning, he feels a little dizzy, slight sense of movement for up to a minute.  He also hears "water" in his ear.  His PCP has never seen fluid on exam.   Decreased since septoplasty and turbinate reduction 3 or 4 months ago.  Prior to surgery, they were occurring 3 days a week.   Past NSAIDS:  Ibuprofen, naproxen Past analgesics:  Tylenol, Excedrin Migraine Past abortive triptans:  sumatriptan 100mg  Past abortive ergotamine:  Trudhesa NS Past muscle relaxants:  none Past anti-emetic:  none Past antihypertensive medications:  none Past antidepressant medications:  none Past anticonvulsant medications:  Topiramate 100mg  twice daily Past anti-CGRP:  Nurtec (rescue), Ubrelvy 100mg  Past vitamins/Herbal/Supplements:  none Past antihistamines/decongestants:  none Other past therapies:  none   Family history of headache:  No  PAST MEDICAL HISTORY: Past Medical History:  Diagnosis Date   Headache    HX  MIGRAINES   Sleep apnea     MEDICATIONS: Current Outpatient Medications on File Prior to Visit  Medication Sig Dispense Refill   Erenumab-aooe (AIMOVIG) 140 MG/ML SOAJ INJECT 140 MG INTO THE SKIN EVERY 28 DAYS. 3 mL 0   fluticasone (FLONASE) 50 MCG/ACT nasal spray Place 1 spray into both nostrils daily as needed for allergies or rhinitis.     gemfibrozil (LOPID) 600 MG tablet Take 600 mg by mouth 2 (two) times daily.  rizatriptan (MAXALT) 10 MG tablet TAKE 1 TABLET BY MOUTH AT EARLIEST ONSET OF MIGRAINE. MAY REPEAT IN 2 HOURS IF NEEDED. MAXIMUM 2 TABLETS IN 24 HOURS 20 tablet 2   No current facility-administered  medications on file prior to visit.    ALLERGIES: No Known Allergies  FAMILY HISTORY: Family History  Problem Relation Age of Onset   Diabetes Mother    Heart failure Father       Objective:  Blood pressure 123/77, pulse 94, height 5\' 10"  (1.778 m), weight 239 lb 9.6 oz (108.7 kg), SpO2 96 %. General: No acute distress.  Patient appears well-groomed.   Head:  Normocephalic/atraumatic Eyes:  Fundi examined but not visualized Neck: supple, no paraspinal tenderness, full range of motion Heart:  Regular rate and rhythm Lungs:  Clear to auscultation bilaterally Back: No paraspinal tenderness Neurological Exam: alert and oriented to person, place, and time.  Speech fluent and not dysarthric, language intact.  CN II-XII intact. Bulk and tone normal, muscle strength 5/5 throughout.  Sensation to light touch intact.  Deep tendon reflexes 2+ throughout, toes downgoing.  Finger to nose testing intact.  Gait normal, Romberg negative.   Metta Clines, DO  CC: Seward Carol, MD

## 2022-09-28 ENCOUNTER — Ambulatory Visit: Payer: BC Managed Care – PPO | Admitting: Neurology

## 2022-09-28 ENCOUNTER — Encounter: Payer: Self-pay | Admitting: Neurology

## 2022-09-28 DIAGNOSIS — G43009 Migraine without aura, not intractable, without status migrainosus: Secondary | ICD-10-CM | POA: Diagnosis not present

## 2022-09-28 MED ORDER — RIZATRIPTAN BENZOATE 10 MG PO TABS: ORAL_TABLET | ORAL | 3 refills | Status: DC

## 2022-09-28 MED ORDER — AIMOVIG 140 MG/ML ~~LOC~~ SOAJ: SUBCUTANEOUS | 3 refills | Status: DC

## 2022-09-28 NOTE — Patient Instructions (Signed)
Refilled Aimovig Refilled rizatriptan Limit use of pain relievers to no more than 2 days out of week to prevent risk of rebound or medication-overuse headache. Keep headache diary Follow up one year.

## 2022-09-30 ENCOUNTER — Telehealth: Payer: Self-pay

## 2022-09-30 ENCOUNTER — Other Ambulatory Visit (HOSPITAL_COMMUNITY): Payer: Self-pay

## 2022-09-30 ENCOUNTER — Telehealth: Payer: Self-pay | Admitting: Pharmacy Technician

## 2022-09-30 DIAGNOSIS — G43009 Migraine without aura, not intractable, without status migrainosus: Secondary | ICD-10-CM

## 2022-09-30 NOTE — Telephone Encounter (Signed)
Patient Advocate Encounter  Received notification from [facility] that prior authorization for AIMOVIG 140MG  is required.   PA submitted on 12.15.23 Key [id number] Status is pending    12.17.23, CPhT Patient Advocate Phone: [phone]

## 2022-09-30 NOTE — Telephone Encounter (Signed)
PA has been submitted.

## 2022-09-30 NOTE — Telephone Encounter (Signed)
Letter received from [facility], PA needed for Aimovig 140 mg

## 2022-10-20 ENCOUNTER — Encounter: Payer: Self-pay | Admitting: Neurology

## 2022-10-26 ENCOUNTER — Telehealth: Payer: Self-pay

## 2022-10-26 ENCOUNTER — Other Ambulatory Visit (HOSPITAL_COMMUNITY): Payer: Self-pay

## 2022-10-26 NOTE — Telephone Encounter (Signed)
Per Pharmacy need the status of PA sent in if not he will not get his Aimovig on time.

## 2022-10-26 NOTE — Telephone Encounter (Signed)
Patient Advocate Encounter   Received notification from [facility] that prior authorization is required for Aimovig 140MG /ML auto-injectors  Submitted: 10-26-2022 Key [id number]   Status is pending

## 2022-10-27 ENCOUNTER — Other Ambulatory Visit (HOSPITAL_COMMUNITY): Payer: Self-pay

## 2022-10-27 NOTE — Telephone Encounter (Signed)
Pharmacy Patient Advocate Encounter  Prior Authorization for Aimovig 140MG /ML auto-injectors has been approved.    PA# PA Case ID #: [id number] Effective dates: 10/26/22 through 10/16/2038  Per test billing copay is $25.00  This can be filled with [facility]

## 2022-10-28 MED ORDER — AIMOVIG 140 MG/ML ~~LOC~~ SOAJ: SUBCUTANEOUS | 3 refills | Status: DC

## 2022-10-28 NOTE — Telephone Encounter (Signed)
Patient advised of approval.  

## 2023-05-15 ENCOUNTER — Other Ambulatory Visit: Payer: Self-pay

## 2023-05-15 ENCOUNTER — Telehealth: Payer: Self-pay | Admitting: Neurology

## 2023-05-15 MED ORDER — RIZATRIPTAN BENZOATE 10 MG PO TABS: ORAL_TABLET | ORAL | 2 refills | Status: DC

## 2023-05-15 NOTE — Telephone Encounter (Signed)
Refill sent in for pt. 

## 2023-05-15 NOTE — Telephone Encounter (Signed)
[facility] called for refill on Maxalt 10mg   Case number [id number] NUMBER : [phone] OPT 2

## 2023-08-15 ENCOUNTER — Encounter: Payer: Self-pay | Admitting: Neurology

## 2023-08-16 ENCOUNTER — Telehealth: Payer: Self-pay

## 2023-08-16 DIAGNOSIS — G43009 Migraine without aura, not intractable, without status migrainosus: Secondary | ICD-10-CM

## 2023-08-16 MED ORDER — AIMOVIG 140 MG/ML ~~LOC~~ SOAJ: SUBCUTANEOUS | 3 refills | Status: AC

## 2023-08-16 MED ORDER — RIZATRIPTAN BENZOATE 10 MG PO TABS: ORAL_TABLET | ORAL | 2 refills | Status: DC

## 2023-08-16 NOTE — Telephone Encounter (Signed)
Per patient mychart message, Hello there, my insurance company is changing who I can do my home delivery with.  My insurance is now w/ [employer].  Could you please send my Aimovig & Rizatriptan to them.     [facility] Delivery Address: [address], [address], [place], [place] [address] Fax: [phone]   Medications sent to the new Pharmacy.

## 2023-09-17 ENCOUNTER — Other Ambulatory Visit: Payer: Self-pay | Admitting: Neurology

## 2023-09-22 NOTE — Progress Notes (Unsigned)
Virtual Visit via Video Note  Consent was obtained for video visit:  Yes.   Answered questions that patient had about telehealth interaction:  Yes.   I discussed the limitations, risks, security and privacy concerns of performing an evaluation and management service by telemedicine. I also discussed with the patient that there may be a patient responsible charge related to this service. The patient expressed understanding and agreed to proceed.  Pt location: Home Physician Location: office Name of referring provider:  Renford Dills, MD I connected with Sofie Rower at patients initiation/request on 09/25/2023 at 10:30 AM EST by video enabled telemedicine application and verified that I am speaking with the correct person using two identifiers. Pt MRN:  [id number] Pt DOB:  Mar 10, 1976 Video Participants:  Sofie Rower  Assessment/Plan:   Migraine without aura, without status migrainosus, not intractable   Migraine prevention:  Aimovig 140mg .  *** Migraine rescue:  rizatriptan 10mg  *** Limit use of pain relievers to no more than 2 days out of week to prevent risk of rebound or medication-overuse headache. Keep headache diary Follow up 1 year. ***  Subjective:  Mark Braun is a 47 year old right-handed man with migraines and sleep apnea who follows up for migraines.   UPDATE: Intensity:  Moderate Duration:  20-30 minutes if rizatriptan (50%), 2 hours and 10 min after second pill (50%) Frequency:  1 to 3 a month Rescue therapy:  Rizatriptan 10mg  Current NSAIDS:  none Current analgesics:   None Current triptans: Maxalt 10mg  Current ergotamine:  none Current anti-emetic:  none Current muscle relaxants:  none Current anti-anxiolytic:  none Current sleep aide:  none Current Antihypertensive medications:  none Current Antidepressant medications:  none Current Anticonvulsant medications: none Current anti-CGRP:  Aimovig 140mg  Current Vitamins/Herbal/Supplements:  magnesium,  CoQ10 Current Antihistamines/Decongestants: Flonase Other therapy:  chiropractic   Caffeine:  No Diet:  Increased water intake.  Not eating as well.  Weight gain Exercise: Not exercising as much now.   Depression:  no; Anxiety:  no Other pain:  ankle Sleep hygiene:  OSA.  Improved since sieptoplasty/turnbinate reduction and CPAP   HISTORY: Onset:  His mid-30s Location:  Right periorbital Quality:  pressure Initial intensity:  7-8/10.  He denies new headache, thunderclap headache Aura:  none Prodrome:  none Postdrome:  none Associated symptoms: Photophobia, phonophobia, sometimes nausea.  He denies associated vomiting, visual disturbance, autonomic symptoms, unilateral numbness or weakness. Initial duration:  24 hours.  Sometimes in 30-40 minutes if sumatriptan taken early but sometimes wakes up with it.   Initial Frequency:  Once a week Initial Frequency of abortive medication: 1 day a week Triggers:  Emotional stress (work-related) Aggravating factors:  Laying down Relieving factors:  Applying ice or pressure to above the eye, sitting up Activity:  Does not aggravate. Every once and a while when he gets up from bed in the morning, he feels a little dizzy, slight sense of movement for up to a minute.  He also hears "water" in his ear.  His PCP has never seen fluid on exam.   Decreased since septoplasty and turbinate reduction 3 or 4 months ago.  Prior to surgery, they were occurring 3 days a week.   Past NSAIDS:  Ibuprofen, naproxen Past analgesics:  Tylenol, Excedrin Migraine Past abortive triptans:  sumatriptan 100mg  Past abortive ergotamine:  Trudhesa NS Past muscle relaxants:  none Past anti-emetic:  none Past antihypertensive medications:  none Past antidepressant medications:  none Past anticonvulsant medications:  Topiramate 100mg  twice daily Past anti-CGRP:  Nurtec (rescue), Ubrelvy 100mg  Past vitamins/Herbal/Supplements:  none Past antihistamines/decongestants:   none Other past therapies:  none   Family history of headache:  No  Past Medical History: Past Medical History:  Diagnosis Date   Headache    HX  MIGRAINES   Sleep apnea     Medications: Outpatient Encounter Medications as of 09/25/2023  Medication Sig   Erenumab-aooe (AIMOVIG) 140 MG/ML SOAJ INJECT 140 MG INTO THE SKIN EVERY 28 DAYS.   gemfibrozil (LOPID) 600 MG tablet Take 600 mg by mouth 2 (two) times daily.   losartan (COZAAR) 50 MG tablet Take 50 mg by mouth daily.   rizatriptan (MAXALT) 10 MG tablet TAKE 1 TABLET BY MOUTH AT EARLIEST ONSET OF MIGRAINE. MAY REPEAT IN 2 HOURS IF NEEDED. MAXIMUM 2 TABLETS IN 24 HOURS   tadalafil (CIALIS) 5 MG tablet Take 5 mg by mouth daily.   No facility-administered encounter medications on file as of 09/25/2023.    Allergies: No Known Allergies  Family History: Family History  Problem Relation Age of Onset   Diabetes Mother    Heart failure Father     Observations/Objective:   *** No acute distress.  Alert and oriented.  Speech fluent and not dysarthric.  Language intact.  Eyes orthophoric on primary gaze.  Face symmetric.   Follow Up Instructions:    -I discussed the assessment and treatment plan with the patient. The patient was provided an opportunity to ask questions and all were answered. The patient agreed with the plan and demonstrated an understanding of the instructions.   The patient was advised to call back or seek an in-person evaluation if the symptoms worsen or if the condition fails to improve as anticipated.   Cira Servant, DO

## 2023-09-25 ENCOUNTER — Encounter: Payer: Self-pay | Admitting: Neurology

## 2023-09-25 ENCOUNTER — Telehealth: Payer: BC Managed Care – PPO | Admitting: Neurology

## 2023-09-25 DIAGNOSIS — G43009 Migraine without aura, not intractable, without status migrainosus: Secondary | ICD-10-CM

## 2023-11-04 ENCOUNTER — Encounter: Payer: Self-pay | Admitting: Neurology

## 2023-11-06 ENCOUNTER — Telehealth: Payer: Self-pay

## 2023-11-06 NOTE — Telephone Encounter (Signed)
PA needed for Aimovig Per patient.

## 2023-11-09 ENCOUNTER — Telehealth: Payer: Self-pay | Admitting: Pharmacy Technician

## 2023-11-09 ENCOUNTER — Other Ambulatory Visit (HOSPITAL_COMMUNITY): Payer: Self-pay

## 2023-11-09 NOTE — Telephone Encounter (Signed)
Pharmacy Patient Advocate Encounter   Received notification from Pt Calls Messages that prior authorization for Aimovig 140MG /ML auto-injectors is required/requested.   Insurance verification completed.   The patient is insured through [employer] .   Per test claim: PA required; PA submitted to above mentioned insurance via CoverMyMeds Key/confirmation #/E[facility] Status is pending

## 2023-11-09 NOTE — Telephone Encounter (Signed)
PA request has been Started. New Encounter created for follow up. For additional info see Pharmacy Prior Auth telephone encounter from 11/09/23.

## 2023-11-21 ENCOUNTER — Other Ambulatory Visit (HOSPITAL_COMMUNITY): Payer: Self-pay

## 2023-11-21 NOTE — Telephone Encounter (Signed)
 Pharmacy Patient Advocate Encounter  Received notification from [employer] that Prior Authorization for Aimovig  140MG /ML auto-injectors has been APPROVED from 11/09/23 to 11/13/24  filled 11/15/23 next fill 11/30/23   PA #/Case ID/Reference #: PA Case ID #: [id number]

## 2024-01-15 ENCOUNTER — Other Ambulatory Visit: Payer: Self-pay | Admitting: Neurology

## 2024-03-05 ENCOUNTER — Encounter: Payer: Self-pay | Admitting: Neurology

## 2024-06-25 ENCOUNTER — Telehealth: Payer: Self-pay

## 2024-06-25 ENCOUNTER — Other Ambulatory Visit (HOSPITAL_COMMUNITY): Payer: Self-pay

## 2024-06-25 NOTE — Telephone Encounter (Signed)
 Pharmacy Patient Advocate Encounter   Received notification from CoverMyMeds that prior authorization for Rizatriptan  Benzoate 10MG  tablets is required/requested.   Insurance verification completed.   The patient is insured through [employer].   Per test claim: PA required; PA started via CoverMyMeds. KEY [id number] . Waiting for clinical questions to populate.

## 2024-07-01 NOTE — Telephone Encounter (Signed)
 Pharmacy Patient Advocate Encounter  Request cancelled out.

## 2024-07-02 ENCOUNTER — Other Ambulatory Visit (HOSPITAL_COMMUNITY): Payer: Self-pay

## 2024-07-02 ENCOUNTER — Telehealth: Payer: Self-pay | Admitting: Pharmacy Technician

## 2024-07-02 NOTE — Telephone Encounter (Signed)
 PA has been submitted, and telephone encounter has been created. Please see telephone encounter dated 9.16.25.

## 2024-07-02 NOTE — Telephone Encounter (Signed)
 Pharmacy Patient Advocate Encounter   Received notification from Pt Calls Messages that prior authorization for RIZATRIPTAN  10MG  is required/requested.   Insurance verification completed.   The patient is insured through [employer] .   Per test claim: PA required; PA submitted to above mentioned insurance via Fax Key/confirmation #/EOC [id number] Status is pending

## 2024-07-03 ENCOUNTER — Other Ambulatory Visit (HOSPITAL_COMMUNITY): Payer: Self-pay

## 2024-07-03 NOTE — Telephone Encounter (Signed)
 Pharmacy Patient Advocate Encounter  Received notification from [employer] that Prior Authorization for RIZATRIPTAN  10MG  has been APPROVED from 9.17.25 to ? with QUANTITY LIMIT of 12 for a 30 day supply. Last filled on 9.5.25 for 10 tablets. Can fill 2 tablets until 9.28.25, OR fill 12 on 9.28.25.  PA #/Case ID/Reference #: [id number]

## 2024-09-25 ENCOUNTER — Ambulatory Visit: Payer: BC Managed Care – PPO | Admitting: Neurology
